# Patient Record
Sex: Female | Born: 1945 | Race: White | Hispanic: No | Smoking: Never smoker
Health system: Southern US, Community
[De-identification: ages and names within clinical notes are randomized; demographics above are authoritative.]

## PROBLEM LIST (undated history)

## (undated) DIAGNOSIS — C50919 Malignant neoplasm of unspecified site of unspecified female breast: Secondary | ICD-10-CM

## (undated) HISTORY — DX: Malignant neoplasm of unspecified site of unspecified female breast: C50.919

---

## 1998-06-15 ENCOUNTER — Other Ambulatory Visit: Admission: RE | Admit: 1998-06-15 | Discharge: 1998-06-15 | Payer: Self-pay | Admitting: Obstetrics and Gynecology

## 1999-09-06 ENCOUNTER — Encounter: Payer: Self-pay | Admitting: Emergency Medicine

## 1999-09-06 ENCOUNTER — Encounter: Admission: RE | Admit: 1999-09-06 | Discharge: 1999-09-06 | Payer: Self-pay | Admitting: Emergency Medicine

## 2000-04-16 ENCOUNTER — Encounter: Payer: Self-pay | Admitting: Obstetrics and Gynecology

## 2000-04-16 ENCOUNTER — Encounter: Admission: RE | Admit: 2000-04-16 | Discharge: 2000-04-16 | Payer: Self-pay | Admitting: Obstetrics and Gynecology

## 2000-05-08 ENCOUNTER — Encounter: Payer: Self-pay | Admitting: Emergency Medicine

## 2000-05-08 ENCOUNTER — Encounter: Admission: RE | Admit: 2000-05-08 | Discharge: 2000-05-08 | Payer: Self-pay | Admitting: Emergency Medicine

## 2001-04-29 ENCOUNTER — Encounter: Payer: Self-pay | Admitting: Emergency Medicine

## 2001-04-29 ENCOUNTER — Encounter: Admission: RE | Admit: 2001-04-29 | Discharge: 2001-04-29 | Payer: Self-pay | Admitting: Emergency Medicine

## 2001-05-01 ENCOUNTER — Encounter: Payer: Self-pay | Admitting: Emergency Medicine

## 2001-05-01 ENCOUNTER — Encounter: Admission: RE | Admit: 2001-05-01 | Discharge: 2001-05-01 | Payer: Self-pay | Admitting: Emergency Medicine

## 2002-04-30 ENCOUNTER — Encounter: Payer: Self-pay | Admitting: Unknown Physician Specialty

## 2002-04-30 ENCOUNTER — Encounter: Admission: RE | Admit: 2002-04-30 | Discharge: 2002-04-30 | Payer: Self-pay | Admitting: Unknown Physician Specialty

## 2003-05-25 ENCOUNTER — Encounter: Payer: Self-pay | Admitting: Unknown Physician Specialty

## 2003-05-25 ENCOUNTER — Encounter: Admission: RE | Admit: 2003-05-25 | Discharge: 2003-05-25 | Payer: Self-pay | Admitting: Unknown Physician Specialty

## 2004-09-03 DIAGNOSIS — C50919 Malignant neoplasm of unspecified site of unspecified female breast: Secondary | ICD-10-CM

## 2004-09-03 HISTORY — DX: Malignant neoplasm of unspecified site of unspecified female breast: C50.919

## 2004-09-03 HISTORY — PX: BREAST LUMPECTOMY: SHX2

## 2004-11-02 ENCOUNTER — Encounter: Admission: RE | Admit: 2004-11-02 | Discharge: 2004-11-02 | Payer: Self-pay | Admitting: Unknown Physician Specialty

## 2004-11-13 ENCOUNTER — Encounter (INDEPENDENT_AMBULATORY_CARE_PROVIDER_SITE_OTHER): Payer: Self-pay | Admitting: Diagnostic Radiology

## 2004-11-13 ENCOUNTER — Encounter: Admission: RE | Admit: 2004-11-13 | Discharge: 2004-11-13 | Payer: Self-pay | Admitting: Unknown Physician Specialty

## 2004-11-13 ENCOUNTER — Encounter (INDEPENDENT_AMBULATORY_CARE_PROVIDER_SITE_OTHER): Payer: Self-pay | Admitting: Specialist

## 2004-11-20 ENCOUNTER — Encounter: Admission: RE | Admit: 2004-11-20 | Discharge: 2004-11-20 | Payer: Self-pay | Admitting: Surgery

## 2004-11-23 ENCOUNTER — Encounter: Admission: RE | Admit: 2004-11-23 | Discharge: 2004-11-23 | Payer: Self-pay | Admitting: Surgery

## 2004-11-24 ENCOUNTER — Encounter: Admission: RE | Admit: 2004-11-24 | Discharge: 2004-11-24 | Payer: Self-pay | Admitting: Surgery

## 2004-11-27 ENCOUNTER — Encounter (INDEPENDENT_AMBULATORY_CARE_PROVIDER_SITE_OTHER): Payer: Self-pay | Admitting: Specialist

## 2004-11-27 ENCOUNTER — Ambulatory Visit (HOSPITAL_BASED_OUTPATIENT_CLINIC_OR_DEPARTMENT_OTHER): Admission: RE | Admit: 2004-11-27 | Discharge: 2004-11-27 | Payer: Self-pay | Admitting: Surgery

## 2004-11-27 ENCOUNTER — Encounter: Admission: RE | Admit: 2004-11-27 | Discharge: 2004-11-27 | Payer: Self-pay | Admitting: Surgery

## 2004-11-27 ENCOUNTER — Ambulatory Visit (HOSPITAL_COMMUNITY): Admission: RE | Admit: 2004-11-27 | Discharge: 2004-11-27 | Payer: Self-pay | Admitting: Surgery

## 2004-11-28 ENCOUNTER — Ambulatory Visit: Admission: RE | Admit: 2004-11-28 | Discharge: 2005-01-26 | Payer: Self-pay | Admitting: Radiation Oncology

## 2004-11-28 ENCOUNTER — Ambulatory Visit: Payer: Self-pay | Admitting: Oncology

## 2004-12-13 ENCOUNTER — Ambulatory Visit (HOSPITAL_COMMUNITY): Admission: RE | Admit: 2004-12-13 | Discharge: 2004-12-13 | Payer: Self-pay | Admitting: Surgery

## 2004-12-27 ENCOUNTER — Ambulatory Visit (HOSPITAL_COMMUNITY): Admission: RE | Admit: 2004-12-27 | Discharge: 2004-12-27 | Payer: Self-pay | Admitting: Oncology

## 2004-12-28 ENCOUNTER — Encounter: Admission: RE | Admit: 2004-12-28 | Discharge: 2004-12-28 | Payer: Self-pay | Admitting: Oncology

## 2005-01-18 ENCOUNTER — Ambulatory Visit: Payer: Self-pay | Admitting: Oncology

## 2005-02-26 ENCOUNTER — Encounter: Admission: RE | Admit: 2005-02-26 | Discharge: 2005-02-26 | Payer: Self-pay | Admitting: Surgery

## 2005-03-13 ENCOUNTER — Ambulatory Visit: Payer: Self-pay | Admitting: Oncology

## 2005-05-08 ENCOUNTER — Ambulatory Visit: Payer: Self-pay | Admitting: Oncology

## 2005-07-06 ENCOUNTER — Encounter: Admission: RE | Admit: 2005-07-06 | Discharge: 2005-07-06 | Payer: Self-pay | Admitting: Radiation Oncology

## 2005-07-24 ENCOUNTER — Encounter: Admission: RE | Admit: 2005-07-24 | Discharge: 2005-07-24 | Payer: Self-pay | Admitting: Surgery

## 2005-08-15 ENCOUNTER — Ambulatory Visit: Payer: Self-pay | Admitting: Oncology

## 2005-11-06 ENCOUNTER — Encounter: Admission: RE | Admit: 2005-11-06 | Discharge: 2005-11-06 | Payer: Self-pay | Admitting: Surgery

## 2005-12-03 ENCOUNTER — Encounter: Admission: RE | Admit: 2005-12-03 | Discharge: 2005-12-03 | Payer: Self-pay | Admitting: Surgery

## 2005-12-13 ENCOUNTER — Ambulatory Visit: Payer: Self-pay | Admitting: Oncology

## 2005-12-14 LAB — COMPREHENSIVE METABOLIC PANEL
ALT: 17 U/L (ref 0–40)
AST: 20 U/L (ref 0–37)
Albumin: 4.6 g/dL (ref 3.5–5.2)
BUN: 14 mg/dL (ref 6–23)
CO2: 28 mEq/L (ref 19–32)
Calcium: 9.1 mg/dL (ref 8.4–10.5)
Chloride: 100 mEq/L (ref 96–112)
Potassium: 4.2 mEq/L (ref 3.5–5.3)

## 2005-12-14 LAB — CBC WITH DIFFERENTIAL/PLATELET
BASO%: 0.5 % (ref 0.0–2.0)
Basophils Absolute: 0 10*3/uL (ref 0.0–0.1)
EOS%: 2.9 % (ref 0.0–7.0)
HCT: 37.7 % (ref 34.8–46.6)
HGB: 12.9 g/dL (ref 11.6–15.9)
MCH: 30.4 pg (ref 26.0–34.0)
MONO#: 0.5 10*3/uL (ref 0.1–0.9)
NEUT#: 2.5 10*3/uL (ref 1.5–6.5)
RDW: 12.9 % (ref 11.3–14.5)
WBC: 4.3 10*3/uL (ref 3.9–10.0)
lymph#: 1.2 10*3/uL (ref 0.9–3.3)

## 2005-12-14 LAB — CANCER ANTIGEN 27.29: CA 27.29: 21 U/mL (ref 0–39)

## 2006-04-12 ENCOUNTER — Ambulatory Visit: Payer: Self-pay | Admitting: Oncology

## 2006-04-12 LAB — CBC WITH DIFFERENTIAL/PLATELET
Basophils Absolute: 0 10*3/uL (ref 0.0–0.1)
EOS%: 1.7 % (ref 0.0–7.0)
Eosinophils Absolute: 0.1 10*3/uL (ref 0.0–0.5)
HGB: 12.9 g/dL (ref 11.6–15.9)
MCH: 30.5 pg (ref 26.0–34.0)
MONO#: 0.5 10*3/uL (ref 0.1–0.9)
NEUT#: 2.9 10*3/uL (ref 1.5–6.5)
RDW: 12.6 % (ref 11.3–14.5)
WBC: 4.8 10*3/uL (ref 3.9–10.0)
lymph#: 1.3 10*3/uL (ref 0.9–3.3)

## 2006-04-12 LAB — COMPREHENSIVE METABOLIC PANEL
ALT: 21 U/L (ref 0–40)
AST: 22 U/L (ref 0–37)
Albumin: 4.6 g/dL (ref 3.5–5.2)
BUN: 12 mg/dL (ref 6–23)
CO2: 25 mEq/L (ref 19–32)
Calcium: 9.4 mg/dL (ref 8.4–10.5)
Chloride: 99 mEq/L (ref 96–112)
Potassium: 4 mEq/L (ref 3.5–5.3)

## 2006-04-12 LAB — LACTATE DEHYDROGENASE: LDH: 145 U/L (ref 94–250)

## 2006-04-12 LAB — CANCER ANTIGEN 27.29: CA 27.29: 20 U/mL (ref 0–39)

## 2006-08-06 ENCOUNTER — Ambulatory Visit: Payer: Self-pay | Admitting: Oncology

## 2006-08-09 LAB — CBC WITH DIFFERENTIAL/PLATELET
BASO%: 0.6 % (ref 0.0–2.0)
Eosinophils Absolute: 0.1 10*3/uL (ref 0.0–0.5)
LYMPH%: 27.3 % (ref 14.0–48.0)
MCHC: 34.3 g/dL (ref 32.0–36.0)
MONO#: 0.4 10*3/uL (ref 0.1–0.9)
NEUT#: 2.7 10*3/uL (ref 1.5–6.5)
Platelets: 228 10*3/uL (ref 145–400)
RBC: 4.32 10*6/uL (ref 3.70–5.32)
RDW: 13 % (ref 11.3–14.5)
WBC: 4.4 10*3/uL (ref 3.9–10.0)

## 2006-08-09 LAB — COMPREHENSIVE METABOLIC PANEL
BUN: 12 mg/dL (ref 6–23)
CO2: 28 mEq/L (ref 19–32)
Calcium: 9.7 mg/dL (ref 8.4–10.5)
Chloride: 101 mEq/L (ref 96–112)
Creatinine, Ser: 0.67 mg/dL (ref 0.40–1.20)
Glucose, Bld: 90 mg/dL (ref 70–99)
Total Bilirubin: 0.3 mg/dL (ref 0.3–1.2)

## 2006-08-09 LAB — LACTATE DEHYDROGENASE: LDH: 164 U/L (ref 94–250)

## 2006-11-08 ENCOUNTER — Encounter: Admission: RE | Admit: 2006-11-08 | Discharge: 2006-11-08 | Payer: Self-pay | Admitting: Oncology

## 2006-12-03 ENCOUNTER — Ambulatory Visit: Payer: Self-pay | Admitting: Oncology

## 2006-12-05 LAB — COMPREHENSIVE METABOLIC PANEL
ALT: 17 U/L (ref 0–35)
AST: 19 U/L (ref 0–37)
Albumin: 4.7 g/dL (ref 3.5–5.2)
Alkaline Phosphatase: 62 U/L (ref 39–117)
Glucose, Bld: 97 mg/dL (ref 70–99)
Potassium: 4.2 mEq/L (ref 3.5–5.3)
Sodium: 138 mEq/L (ref 135–145)
Total Bilirubin: 0.4 mg/dL (ref 0.3–1.2)
Total Protein: 7.1 g/dL (ref 6.0–8.3)

## 2006-12-05 LAB — CBC WITH DIFFERENTIAL/PLATELET
BASO%: 0.4 % (ref 0.0–2.0)
Eosinophils Absolute: 0.1 10*3/uL (ref 0.0–0.5)
LYMPH%: 25.1 % (ref 14.0–48.0)
MCHC: 34.7 g/dL (ref 32.0–36.0)
MCV: 88.3 fL (ref 81.0–101.0)
MONO%: 9.1 % (ref 0.0–13.0)
NEUT#: 3.1 10*3/uL (ref 1.5–6.5)
RBC: 4.31 10*6/uL (ref 3.70–5.32)
RDW: 12.8 % (ref 11.3–14.5)
WBC: 4.9 10*3/uL (ref 3.9–10.0)

## 2006-12-05 LAB — CANCER ANTIGEN 27.29: CA 27.29: 25 U/mL (ref 0–39)

## 2007-06-18 ENCOUNTER — Ambulatory Visit: Payer: Self-pay | Admitting: Oncology

## 2007-06-20 LAB — COMPREHENSIVE METABOLIC PANEL
ALT: 17 U/L (ref 0–35)
AST: 17 U/L (ref 0–37)
BUN: 16 mg/dL (ref 6–23)
Calcium: 9.4 mg/dL (ref 8.4–10.5)
Chloride: 102 mEq/L (ref 96–112)
Creatinine, Ser: 0.75 mg/dL (ref 0.40–1.20)
Total Bilirubin: 0.5 mg/dL (ref 0.3–1.2)

## 2007-06-20 LAB — CBC WITH DIFFERENTIAL/PLATELET
BASO%: 0.4 % (ref 0.0–2.0)
Basophils Absolute: 0 10*3/uL (ref 0.0–0.1)
EOS%: 2.5 % (ref 0.0–7.0)
HCT: 37.7 % (ref 34.8–46.6)
HGB: 13.2 g/dL (ref 11.6–15.9)
LYMPH%: 22.7 % (ref 14.0–48.0)
MCH: 30.7 pg (ref 26.0–34.0)
MCHC: 34.9 g/dL (ref 32.0–36.0)
MCV: 87.9 fL (ref 81.0–101.0)
MONO%: 9 % (ref 0.0–13.0)
NEUT%: 65.4 % (ref 39.6–76.8)
Platelets: 233 10*3/uL (ref 145–400)
lymph#: 1.2 10*3/uL (ref 0.9–3.3)

## 2007-06-20 LAB — CANCER ANTIGEN 27.29: CA 27.29: 20 U/mL (ref 0–39)

## 2007-11-10 ENCOUNTER — Encounter: Admission: RE | Admit: 2007-11-10 | Discharge: 2007-11-10 | Payer: Self-pay | Admitting: Oncology

## 2007-11-18 ENCOUNTER — Encounter: Admission: RE | Admit: 2007-11-18 | Discharge: 2007-11-18 | Payer: Self-pay | Admitting: Oncology

## 2007-12-08 ENCOUNTER — Ambulatory Visit: Payer: Self-pay | Admitting: Oncology

## 2007-12-10 LAB — CBC WITH DIFFERENTIAL/PLATELET
Eosinophils Absolute: 0.1 10*3/uL (ref 0.0–0.5)
HCT: 36.7 % (ref 34.8–46.6)
HGB: 12.9 g/dL (ref 11.6–15.9)
LYMPH%: 24.7 % (ref 14.0–48.0)
MONO#: 0.5 10*3/uL (ref 0.1–0.9)
NEUT#: 3.5 10*3/uL (ref 1.5–6.5)
NEUT%: 63 % (ref 39.6–76.8)
Platelets: 229 10*3/uL (ref 145–400)
WBC: 5.6 10*3/uL (ref 3.9–10.0)

## 2007-12-11 LAB — COMPREHENSIVE METABOLIC PANEL
CO2: 25 mEq/L (ref 19–32)
Calcium: 9.6 mg/dL (ref 8.4–10.5)
Chloride: 100 mEq/L (ref 96–112)
Creatinine, Ser: 0.71 mg/dL (ref 0.40–1.20)
Glucose, Bld: 96 mg/dL (ref 70–99)
Total Bilirubin: 0.5 mg/dL (ref 0.3–1.2)
Total Protein: 7.5 g/dL (ref 6.0–8.3)

## 2007-12-11 LAB — VITAMIN D 25 HYDROXY (VIT D DEFICIENCY, FRACTURES): Vit D, 25-Hydroxy: 27 ng/mL — ABNORMAL LOW (ref 30–89)

## 2007-12-11 LAB — LACTATE DEHYDROGENASE: LDH: 161 U/L (ref 94–250)

## 2007-12-11 LAB — CANCER ANTIGEN 27.29: CA 27.29: 22 U/mL (ref 0–39)

## 2007-12-17 LAB — VITAMIN D 1,25 DIHYDROXY: Vit D, 1,25-Dihydroxy: 68 pg/mL (ref 15–75)

## 2008-06-09 ENCOUNTER — Ambulatory Visit: Payer: Self-pay | Admitting: Hematology & Oncology

## 2008-06-10 LAB — CBC WITH DIFFERENTIAL (CANCER CENTER ONLY)
BASO%: 0.5 % (ref 0.0–2.0)
LYMPH%: 27.1 % (ref 14.0–48.0)
MCH: 29.3 pg (ref 26.0–34.0)
MCV: 86 fL (ref 81–101)
MONO%: 7.9 % (ref 0.0–13.0)
Platelets: 210 10*3/uL (ref 145–400)
RDW: 11.7 % (ref 10.5–14.6)

## 2008-06-10 LAB — COMPREHENSIVE METABOLIC PANEL
ALT: 18 U/L (ref 0–35)
AST: 20 U/L (ref 0–37)
Alkaline Phosphatase: 53 U/L (ref 39–117)
Calcium: 10.2 mg/dL (ref 8.4–10.5)
Chloride: 100 mEq/L (ref 96–112)
Creatinine, Ser: 0.75 mg/dL (ref 0.40–1.20)
Total Bilirubin: 0.5 mg/dL (ref 0.3–1.2)

## 2008-11-16 ENCOUNTER — Encounter: Admission: RE | Admit: 2008-11-16 | Discharge: 2008-11-16 | Payer: Self-pay | Admitting: Unknown Physician Specialty

## 2008-12-15 ENCOUNTER — Ambulatory Visit: Payer: Self-pay | Admitting: Hematology & Oncology

## 2008-12-16 LAB — CBC WITH DIFFERENTIAL (CANCER CENTER ONLY)
BASO#: 0 10*3/uL (ref 0.0–0.2)
Eosinophils Absolute: 0.1 10*3/uL (ref 0.0–0.5)
HGB: 12 g/dL (ref 11.6–15.9)
LYMPH%: 22.3 % (ref 14.0–48.0)
MCH: 29.4 pg (ref 26.0–34.0)
MCV: 89 fL (ref 81–101)
MONO%: 5.3 % (ref 0.0–13.0)
RBC: 4.07 10*6/uL (ref 3.70–5.32)

## 2008-12-16 LAB — COMPREHENSIVE METABOLIC PANEL
Alkaline Phosphatase: 48 U/L (ref 39–117)
Glucose, Bld: 106 mg/dL — ABNORMAL HIGH (ref 70–99)
Sodium: 138 mEq/L (ref 135–145)
Total Bilirubin: 0.4 mg/dL (ref 0.3–1.2)
Total Protein: 6.8 g/dL (ref 6.0–8.3)

## 2009-06-15 ENCOUNTER — Ambulatory Visit: Payer: Self-pay | Admitting: Hematology & Oncology

## 2009-06-16 LAB — CBC WITH DIFFERENTIAL (CANCER CENTER ONLY)
BASO#: 0 10*3/uL (ref 0.0–0.2)
Eosinophils Absolute: 0.1 10*3/uL (ref 0.0–0.5)
HCT: 36.6 % (ref 34.8–46.6)
HGB: 12.4 g/dL (ref 11.6–15.9)
LYMPH#: 1.6 10*3/uL (ref 0.9–3.3)
MCH: 30.4 pg (ref 26.0–34.0)
MONO%: 7.1 % (ref 0.0–13.0)
NEUT#: 3.9 10*3/uL (ref 1.5–6.5)
RBC: 4.09 10*6/uL (ref 3.70–5.32)

## 2009-06-17 LAB — COMPREHENSIVE METABOLIC PANEL
Albumin: 4.4 g/dL (ref 3.5–5.2)
Alkaline Phosphatase: 50 U/L (ref 39–117)
Calcium: 9.5 mg/dL (ref 8.4–10.5)
Total Protein: 6.9 g/dL (ref 6.0–8.3)

## 2009-11-17 ENCOUNTER — Encounter: Admission: RE | Admit: 2009-11-17 | Discharge: 2009-11-17 | Payer: Self-pay | Admitting: Hematology & Oncology

## 2009-12-13 ENCOUNTER — Ambulatory Visit: Payer: Self-pay | Admitting: Hematology & Oncology

## 2009-12-15 LAB — COMPREHENSIVE METABOLIC PANEL
ALT: 19 U/L (ref 0–35)
AST: 24 U/L (ref 0–37)
Alkaline Phosphatase: 56 U/L (ref 39–117)
BUN: 16 mg/dL (ref 6–23)
Chloride: 100 mEq/L (ref 96–112)
Creatinine, Ser: 0.76 mg/dL (ref 0.40–1.20)
Potassium: 3.9 mEq/L (ref 3.5–5.3)
Sodium: 137 mEq/L (ref 135–145)

## 2009-12-15 LAB — CBC WITH DIFFERENTIAL (CANCER CENTER ONLY)
BASO#: 0 10*3/uL (ref 0.0–0.2)
Eosinophils Absolute: 0.1 10*3/uL (ref 0.0–0.5)
HGB: 12.4 g/dL (ref 11.6–15.9)
LYMPH#: 1.5 10*3/uL (ref 0.9–3.3)
MONO#: 0.4 10*3/uL (ref 0.1–0.9)
NEUT#: 3.1 10*3/uL (ref 1.5–6.5)
Platelets: 214 10*3/uL (ref 145–400)
RBC: 4.17 10*6/uL (ref 3.70–5.32)
WBC: 5.2 10*3/uL (ref 3.9–10.0)

## 2009-12-15 LAB — VITAMIN D 25 HYDROXY (VIT D DEFICIENCY, FRACTURES): Vit D, 25-Hydroxy: 52 ng/mL (ref 30–89)

## 2010-10-13 ENCOUNTER — Other Ambulatory Visit: Payer: Self-pay | Admitting: Unknown Physician Specialty

## 2010-10-13 DIAGNOSIS — Z780 Asymptomatic menopausal state: Secondary | ICD-10-CM

## 2010-10-13 DIAGNOSIS — Z9889 Other specified postprocedural states: Secondary | ICD-10-CM

## 2010-11-23 ENCOUNTER — Ambulatory Visit
Admission: RE | Admit: 2010-11-23 | Discharge: 2010-11-23 | Disposition: A | Discharge status: Home / Self Care | Payer: Managed Care, Other (non HMO) | Source: Ambulatory Visit | Attending: *Deleted | Admitting: *Deleted

## 2010-11-23 DIAGNOSIS — Z9889 Other specified postprocedural states: Secondary | ICD-10-CM

## 2010-11-23 DIAGNOSIS — Z780 Asymptomatic menopausal state: Secondary | ICD-10-CM

## 2010-12-13 ENCOUNTER — Encounter (HOSPITAL_BASED_OUTPATIENT_CLINIC_OR_DEPARTMENT_OTHER): Payer: Managed Care, Other (non HMO) | Admitting: Hematology & Oncology

## 2010-12-13 DIAGNOSIS — C50419 Malignant neoplasm of upper-outer quadrant of unspecified female breast: Secondary | ICD-10-CM

## 2010-12-13 DIAGNOSIS — Z170 Estrogen receptor positive status [ER+]: Secondary | ICD-10-CM

## 2010-12-21 ENCOUNTER — Encounter: Payer: Managed Care, Other (non HMO) | Admitting: Hematology & Oncology

## 2010-12-21 ENCOUNTER — Other Ambulatory Visit: Payer: Self-pay | Admitting: Hematology & Oncology

## 2010-12-22 LAB — VITAMIN D 25 HYDROXY (VIT D DEFICIENCY, FRACTURES): Vit D, 25-Hydroxy: 64 ng/mL (ref 30–89)

## 2011-01-19 NOTE — Op Note (Signed)
Katrina Holt, Katrina Holt                  ACCOUNT NO.:  [id number]   MEDICAL RECORD NO.:  [id number]          PATIENT TYPE:  AMB   LOCATION:  DAY                          FACILITY:  [facility]   PHYSICIAN:  Currie Paris, M.D.DATE OF BIRTH:  [birth year]   DATE OF PROCEDURE:  12/13/2004  DATE OF DISCHARGE:                                 OPERATIVE REPORT   PREOPERATIVE DIAGNOSIS:  Carcinoma of the left breast, upper outer quadrant.   POSTOPERATIVE DIAGNOSIS:  Carcinoma of the left breast, upper outer  quadrant.   OPERATION:  Placement of MammoSite.   SURGEON:  Currie Paris, M.D.   ANESTHESIA:  MAC.   CLINICAL HISTORY:  This is a 64 year old lady who has had lumpectomy and was  candidate for MammoSite radiation therapy.   DESCRIPTION OF PROCEDURE:  The patient was seen in the holding area and had  no further questions.  The left breast was marked as the operative site and  confirmed by the patient.   She was taken to the operating room and given some IV sedation.  The breast  was ultrasounded and we could see a good seroma cavity.   The breast was then prepped and draped.  The time-out occurred.   I Infiltrated 1% Xylocaine at the medial aspect of the incision for  placement of the MammoSite with the scar entry technique.  The medial aspect  of the scar was opened with the scalpel and a hemostat used to enter the  cavity.  I had already injected a little local into the cavity as well.  We  got some drainage of the seroma.  We then prepared an oval MammoSite to  place and, unfortunately, this was not symmetric so we checked a second oval  one, which was likewise not symmetric.  I re-assessed the cavity and I  thought that the spherical small one which would be inflated to  approximately 45-60 mL should also work, and this was checked.  It had a  symmetric balloon.   It was placed readily into the seroma cavity.  I put one suture just lateral  to so that the pressure of the  MammoSite would not cause the scar to open  any further.  The balloon was inflated to 45 mL.  Ultrasound showed what appeared to be good conformance of the cavity to the  balloon.  There appeared to be no residual seroma.  The closest skin to  balloon distance was 0.97 cm.   The patient tolerated procedure well.  Sterile dressings were applied.      CJS/MEDQ  D:  12/13/2004  T:  12/13/2004  Job:  [id number]

## 2011-01-19 NOTE — Op Note (Signed)
Katrina Holt, Katrina Holt                  ACCOUNT NO.:  [id number]   MEDICAL RECORD NO.:  [id number]          PATIENT TYPE:  AMB   LOCATION:  DSC                          FACILITY:  [facility]   PHYSICIAN:  Currie Paris, M.D.DATE OF BIRTH:  1946/08/31   DATE OF PROCEDURE:  11/27/2004  DATE OF DISCHARGE:                                 OPERATIVE REPORT   OFFICE MEDICAL RECORD NUMBER:  [id number].   PREOPERATIVE DIAGNOSIS:  Carcinoma left breast upper outer quadrant.   POSTOPERATIVE DIAGNOSIS:  Carcinoma left breast upper outer quadrant.   OPERATION:  Needle-guided excision left breast cancer with blue dye  injection and axillary sentinel lymph node biopsy (two nodes).   SURGEON:  Cyndia Bent, M.D.   ANESTHESIA:  General.   CLINICAL HISTORY:  This is a 65 year old recently found by mammogram to have  a small breast cancer in the upper outer quadrant of the left breast.  After  discussion of alternatives, we elected to proceed to lumpectomy with  sentinel node biopsy.   DESCRIPTION OF PROCEDURE:  The patient was seen in the holding area and she  had no further questions.  She was taken to the operating room and after  satisfactory general anesthesia had been obtained, the breast nipple areolar  area was prepped with alcohol.  The timeout occurred.   I injected about 4 mL of dilute methylene blue subareolarly and massaged  this in.  The breast was then completely prepped with Betadine and draped.  The guide wire entered laterally and tracked medially almost in a direct  horizontal position just in the upper outer quadrant of the breast.  An X  marked the spot on the breast where the tumor was supposedly directly below.  I made a transverse or horizontally oriented incision, staying medial to the  guide wire entry site and divided about 1 cm of breast tissue.  I then was  able to manipulate the guide wire into the wound and then began a wide  excision of the area going down to  fascia and going in all directions.  The  tissue was a little bit soft superficially but I felt that I had a good  margin between the superficial side and the tumor, although the fat had  begun to split a little bit.  By palpation, I could feel the mass once we  had gotten down to it and I felt that I was completely around it.  It was  oriented and sent for specimen mammography and then for touch preps.   I injected some Marcaine to help with postoperative analgesia and placed a  pack once everything was dry.  Using the Neo-Probe, I identified a hot area  in the axilla, made a transverse incision, divided the subcutaneous tissues  and placed a self-retaining retractor.  Blue lymphatic was found and traced  and led to a blue lymph node and there were two adjacent blue lymph nodes  and these were removed.  The first lymph node actually came out of specimen  2 and the second one of specimen 1.  Specimen 2 was much more darkly blue  and had counts up to 900, whereas specimen 1 had counts only of about 90.  There were no other blue lymphatics noticed, no palpable adenopathy, and  counts in the background ranged from 0 to 10 once these two nodes were out.   I again injected some Marcaine here and then checked for hemostasis.  Everything was dry so I closed this incision with 3-0 Vicryl followed by 4-0  Monocryl subcuticular.   Attention was turned back to the breast site and it had remained dry.  I  closed it in layers with some 3-0 Vicryl so that we would have at least 1 cm  of distance between the skin and the lumpectomy site for possible mammosite  placement.  Skin was then closed with 4-0 Monocryl subcuticular.   Pathology reported that the lymph nodes were negative and my margins were  negative.  This concluded the procedure.   All counts were correct.  There were no operative complications.  Estimated  blood loss was less than 20 mL.  The patient tolerated the procedure well.       CJS/MEDQ  D:  11/27/2004  T:  11/27/2004  Job:  [id number]   cc:   Myra Rude, M.D.  [place], [place] [address]

## 2011-06-27 ENCOUNTER — Encounter (HOSPITAL_BASED_OUTPATIENT_CLINIC_OR_DEPARTMENT_OTHER): Payer: Managed Care, Other (non HMO) | Admitting: Hematology & Oncology

## 2011-06-27 DIAGNOSIS — Z853 Personal history of malignant neoplasm of breast: Secondary | ICD-10-CM

## 2011-10-22 ENCOUNTER — Other Ambulatory Visit: Payer: Self-pay | Admitting: Hematology & Oncology

## 2011-10-22 DIAGNOSIS — Z853 Personal history of malignant neoplasm of breast: Secondary | ICD-10-CM

## 2011-10-22 DIAGNOSIS — Z9889 Other specified postprocedural states: Secondary | ICD-10-CM

## 2011-11-26 ENCOUNTER — Ambulatory Visit
Admission: RE | Admit: 2011-11-26 | Discharge: 2011-11-26 | Disposition: A | Discharge status: Home / Self Care | Payer: Commercial Indemnity | Source: Ambulatory Visit | Attending: Hematology & Oncology | Admitting: Hematology & Oncology

## 2011-11-26 DIAGNOSIS — Z853 Personal history of malignant neoplasm of breast: Secondary | ICD-10-CM

## 2011-11-26 DIAGNOSIS — Z9889 Other specified postprocedural states: Secondary | ICD-10-CM

## 2012-06-18 ENCOUNTER — Ambulatory Visit (HOSPITAL_BASED_OUTPATIENT_CLINIC_OR_DEPARTMENT_OTHER): Payer: BC Managed Care – PPO | Admitting: Hematology & Oncology

## 2012-06-18 VITALS — BP 134/65 | HR 65 | Temp 97.8°F | Resp 16 | Ht 64.0 in | Wt 145.0 lb

## 2012-06-18 DIAGNOSIS — Z853 Personal history of malignant neoplasm of breast: Secondary | ICD-10-CM

## 2012-06-18 DIAGNOSIS — M810 Age-related osteoporosis without current pathological fracture: Secondary | ICD-10-CM

## 2012-06-18 DIAGNOSIS — C50919 Malignant neoplasm of unspecified site of unspecified female breast: Secondary | ICD-10-CM

## 2012-06-18 NOTE — Progress Notes (Signed)
This office note has been dictated.

## 2012-06-19 NOTE — Progress Notes (Signed)
CC:   Harl Bowie, MD Currie Paris, M.D.  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  CURRENT THERAPY:  Observation.  INTERIM HISTORY:  Ms Weatherbee comes in for followup.  She is doing well. We see her yearly.  She is still working.  She had no problems last year.  Her last mammogram was done back in March and everything looked okay on the mammogram.  She has had no bony pain.  There has been no change in bowel or bladder habits.  There has been no cough or shortness breath.  She does not take vitamin D.  I recommended to her that she take vitamin D 1000 units a day.  She does take a baby aspirin. She has had no rashes.  She has had no leg swelling.  PHYSICAL EXAMINATION:  This is a well-developed, well-nourished white female in no obvious distress.  Vital signs:  Temperature of 97.8, pulse 65, respiratory rate 16, blood pressure 134/65.  Weight is 145.  Head and Neck Exam:  Shows a normocephalic, atraumatic skull.  There are no ocular or oral lesions.  There are no palpable cervical or supraclavicular lymph nodes.  Lungs:  Clear bilaterally.  Cardiac Exam: Regular rate and rhythm with a normal S1, S2.  There are no murmurs, rubs or bruits.  Breast Exam:  Shows right breast, no masses, edema or erythema.  There is no right axillary adenopathy.  Left breast is well- healed lumpectomy at the 1 o'clock position.  She has a slight contraction of the left breast.  There is some slight firmness at the lumpectomy site.  No distinct masses noted in the left breast.  There is no left axillary adenopathy.  Abdominal Exam:  Soft with good bowel sounds.  There is no fluid wave.  There is no guarding or rebound tenderness.  There is no palpable hepatosplenomegaly.  Back Exam:  No tenderness over the spine, ribs, or hips.  Extremities:  Shows no clubbing, cyanosis or edema.  Skin Exam:  No rashes, ecchymoses or petechia.  Labs were not done this  visit.  IMPRESSION:  Ms. Schwamberger is a very nice 66 year old postmenopausal white female with history of stage I adenocarcinoma of the left breast.  She is doing well.  I see no evidence of recurrent disease.  I do not see that we need to do any special lab work or x-ray tests on her.  I will plan to get her back in 1 more year.   ______________________________ Josph Macho, M.D. PRE/MEDQ  D:  06/18/2012  T:  06/19/2012  Job:  [id number]

## 2012-06-25 ENCOUNTER — Ambulatory Visit: Payer: Managed Care, Other (non HMO) | Admitting: Hematology & Oncology

## 2012-06-25 ENCOUNTER — Ambulatory Visit: Payer: Managed Care, Other (non HMO) | Admitting: Family

## 2012-11-21 ENCOUNTER — Other Ambulatory Visit: Payer: Self-pay | Admitting: Unknown Physician Specialty

## 2012-11-21 DIAGNOSIS — Z9889 Other specified postprocedural states: Secondary | ICD-10-CM

## 2012-11-21 DIAGNOSIS — Z853 Personal history of malignant neoplasm of breast: Secondary | ICD-10-CM

## 2012-11-21 DIAGNOSIS — M8580 Other specified disorders of bone density and structure, unspecified site: Secondary | ICD-10-CM

## 2012-12-11 ENCOUNTER — Ambulatory Visit
Admission: RE | Admit: 2012-12-11 | Discharge: 2012-12-11 | Disposition: A | Discharge status: Home / Self Care | Payer: BC Managed Care – PPO | Source: Ambulatory Visit | Attending: Unknown Physician Specialty | Admitting: Unknown Physician Specialty

## 2012-12-11 DIAGNOSIS — Z9889 Other specified postprocedural states: Secondary | ICD-10-CM

## 2012-12-11 DIAGNOSIS — Z853 Personal history of malignant neoplasm of breast: Secondary | ICD-10-CM

## 2012-12-11 DIAGNOSIS — M8580 Other specified disorders of bone density and structure, unspecified site: Secondary | ICD-10-CM

## 2013-02-10 DIAGNOSIS — Q211 Atrial septal defect: Secondary | ICD-10-CM | POA: Diagnosis not present

## 2013-02-10 DIAGNOSIS — I059 Rheumatic mitral valve disease, unspecified: Secondary | ICD-10-CM | POA: Diagnosis not present

## 2013-03-02 DIAGNOSIS — H43819 Vitreous degeneration, unspecified eye: Secondary | ICD-10-CM | POA: Diagnosis not present

## 2013-03-02 DIAGNOSIS — H04129 Dry eye syndrome of unspecified lacrimal gland: Secondary | ICD-10-CM | POA: Diagnosis not present

## 2013-03-02 DIAGNOSIS — H40019 Open angle with borderline findings, low risk, unspecified eye: Secondary | ICD-10-CM | POA: Diagnosis not present

## 2013-03-02 DIAGNOSIS — Z961 Presence of intraocular lens: Secondary | ICD-10-CM | POA: Diagnosis not present

## 2013-04-01 DIAGNOSIS — M25819 Other specified joint disorders, unspecified shoulder: Secondary | ICD-10-CM | POA: Diagnosis not present

## 2013-04-01 DIAGNOSIS — M19019 Primary osteoarthritis, unspecified shoulder: Secondary | ICD-10-CM | POA: Diagnosis not present

## 2013-04-01 DIAGNOSIS — Z809 Family history of malignant neoplasm, unspecified: Secondary | ICD-10-CM | POA: Diagnosis not present

## 2013-04-01 DIAGNOSIS — M249 Joint derangement, unspecified: Secondary | ICD-10-CM | POA: Diagnosis not present

## 2013-04-01 DIAGNOSIS — I10 Essential (primary) hypertension: Secondary | ICD-10-CM | POA: Diagnosis not present

## 2013-04-01 DIAGNOSIS — Z853 Personal history of malignant neoplasm of breast: Secondary | ICD-10-CM | POA: Diagnosis not present

## 2013-04-01 DIAGNOSIS — M24019 Loose body in unspecified shoulder: Secondary | ICD-10-CM | POA: Diagnosis not present

## 2013-04-01 DIAGNOSIS — I739 Peripheral vascular disease, unspecified: Secondary | ICD-10-CM | POA: Diagnosis not present

## 2013-04-01 DIAGNOSIS — M75110 Incomplete rotator cuff tear or rupture of unspecified shoulder, not specified as traumatic: Secondary | ICD-10-CM | POA: Diagnosis not present

## 2013-04-01 DIAGNOSIS — M25519 Pain in unspecified shoulder: Secondary | ICD-10-CM | POA: Diagnosis not present

## 2013-04-01 DIAGNOSIS — S46819A Strain of other muscles, fascia and tendons at shoulder and upper arm level, unspecified arm, initial encounter: Secondary | ICD-10-CM | POA: Diagnosis not present

## 2013-04-09 DIAGNOSIS — M25519 Pain in unspecified shoulder: Secondary | ICD-10-CM | POA: Diagnosis not present

## 2013-04-20 DIAGNOSIS — M25519 Pain in unspecified shoulder: Secondary | ICD-10-CM | POA: Diagnosis not present

## 2013-04-28 DIAGNOSIS — M25519 Pain in unspecified shoulder: Secondary | ICD-10-CM | POA: Diagnosis not present

## 2013-05-07 DIAGNOSIS — M25519 Pain in unspecified shoulder: Secondary | ICD-10-CM | POA: Diagnosis not present

## 2013-05-14 DIAGNOSIS — M25519 Pain in unspecified shoulder: Secondary | ICD-10-CM | POA: Diagnosis not present

## 2013-05-27 DIAGNOSIS — M25519 Pain in unspecified shoulder: Secondary | ICD-10-CM | POA: Diagnosis not present

## 2013-06-03 DIAGNOSIS — M25519 Pain in unspecified shoulder: Secondary | ICD-10-CM | POA: Diagnosis not present

## 2013-06-16 DIAGNOSIS — M25519 Pain in unspecified shoulder: Secondary | ICD-10-CM | POA: Diagnosis not present

## 2013-06-18 ENCOUNTER — Ambulatory Visit (HOSPITAL_BASED_OUTPATIENT_CLINIC_OR_DEPARTMENT_OTHER): Payer: Medicare Other | Admitting: Hematology & Oncology

## 2013-06-18 ENCOUNTER — Other Ambulatory Visit (HOSPITAL_BASED_OUTPATIENT_CLINIC_OR_DEPARTMENT_OTHER): Payer: Medicare Other | Admitting: Lab

## 2013-06-18 VITALS — BP 153/80 | HR 62 | Temp 97.7°F | Resp 14 | Ht 64.0 in | Wt 151.0 lb

## 2013-06-18 DIAGNOSIS — D0590 Unspecified type of carcinoma in situ of unspecified breast: Secondary | ICD-10-CM

## 2013-06-18 DIAGNOSIS — C50912 Malignant neoplasm of unspecified site of left female breast: Secondary | ICD-10-CM

## 2013-06-18 DIAGNOSIS — C50919 Malignant neoplasm of unspecified site of unspecified female breast: Secondary | ICD-10-CM

## 2013-06-18 DIAGNOSIS — M810 Age-related osteoporosis without current pathological fracture: Secondary | ICD-10-CM

## 2013-06-18 LAB — CBC WITH DIFFERENTIAL (CANCER CENTER ONLY)
EOS%: 1.8 % (ref 0.0–7.0)
Eosinophils Absolute: 0.1 10*3/uL (ref 0.0–0.5)
LYMPH%: 24.6 % (ref 14.0–48.0)
MCH: 30.4 pg (ref 26.0–34.0)
MCHC: 33.4 g/dL (ref 32.0–36.0)
MCV: 91 fL (ref 81–101)
MONO%: 10.4 % (ref 0.0–13.0)
NEUT#: 3.9 10*3/uL (ref 1.5–6.5)
Platelets: 211 10*3/uL (ref 145–400)
RBC: 4.04 10*6/uL (ref 3.70–5.32)
RDW: 12.7 % (ref 11.1–15.7)

## 2013-06-18 NOTE — Progress Notes (Signed)
This office note has been dictated.

## 2013-06-19 DIAGNOSIS — Z23 Encounter for immunization: Secondary | ICD-10-CM | POA: Diagnosis not present

## 2013-06-19 LAB — COMPREHENSIVE METABOLIC PANEL
AST: 23 U/L (ref 0–37)
Alkaline Phosphatase: 55 U/L (ref 39–117)
Glucose, Bld: 91 mg/dL (ref 70–99)
Sodium: 135 mEq/L (ref 135–145)
Total Bilirubin: 0.4 mg/dL (ref 0.3–1.2)
Total Protein: 6.8 g/dL (ref 6.0–8.3)

## 2013-06-19 NOTE — Progress Notes (Signed)
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  CURRENT THERAPY:  Observation.  INTERIM HISTORY:  Ms. Timko comes in for followup.  She is doing pretty well.  She has noted that she has had some hair loss.  Her hair was thick to begin with.  However, she has noticed this over the past several months.  She had her thyroid checked earlier this year.  This was normal.  There has been no change in her medications.  She has had no other issues.  She had shoulder surgery for the right shoulder in July.  This is doing better.  She is recovering from this.  She has had no problems with bowels or bladder.  She has had no leg swelling.  There have been no rashes.  Her last mammogram was back in April and .everything looked fine.  PHYSICAL EXAMINATION:  General:  This is a well-developed, well- nourished white female in no obvious distress.  Vital Signs:  Showed temperature of 97.7, pulse 62, respiratory rate 14, blood pressure 153/80.  Weight is 151 pounds.  Head and Neck:  Shows a normocephalic, atraumatic skull.  There are no ocular or oral lesions.  There are no palpable cervical or supraclavicular lymph nodes.  Lungs:  Clear bilaterally.  Cardiac:  Regular rate and rhythm with a normal S1, S2. There are no murmurs, rubs, or bruits.  Abdomen:  Soft.  She has good bowel sounds.  There is no fluid wave.  There is no palpable abdominal mass.  There is no palpable hepatosplenomegaly.  Extremities:  Show no clubbing, cyanosis, or edema.  She has good strength in her arms and legs.  She does have decreased range of motion of the right shoulder from her surgery.  Breasts:  Shows right breast with no masses, edema, or erythema.  There is no right axillary adenopathy.  Left breast shows some slight contraction at the lumpectomy site.  Her lumpectomy is at the 1 o'clock position.  No distinct masses noted in the left breast. There is no left axillary  adenopathy.  LABORATORY STUDIES:  White cell count is 6.3, hemoglobin 12.3, hematocrit 36.8, platelet count 211.  IMPRESSION:  Ms. Livingston is very charming 67 year old postmenopausal female with stage I ductal carcinoma of the left breast.  She underwent lumpectomy.  She was taking Arimidex.  She had this 8 years ago.  She has been on Arimidex now for 3 years.  I do not see any evidence of recurrence of her malignancy.  We will go ahead and plan to get her back in another year.    ______________________________ Josph Macho, M.D. PRE/MEDQ  D:  06/18/2013  T:  06/19/2013  Job:  [id number]

## 2013-07-01 DIAGNOSIS — M25579 Pain in unspecified ankle and joints of unspecified foot: Secondary | ICD-10-CM | POA: Diagnosis not present

## 2013-07-13 DIAGNOSIS — S0180XA Unspecified open wound of other part of head, initial encounter: Secondary | ICD-10-CM | POA: Diagnosis not present

## 2013-07-13 DIAGNOSIS — Y92009 Unspecified place in unspecified non-institutional (private) residence as the place of occurrence of the external cause: Secondary | ICD-10-CM | POA: Diagnosis not present

## 2013-07-20 DIAGNOSIS — T148XXA Other injury of unspecified body region, initial encounter: Secondary | ICD-10-CM | POA: Diagnosis not present

## 2013-07-20 DIAGNOSIS — Z4802 Encounter for removal of sutures: Secondary | ICD-10-CM | POA: Diagnosis not present

## 2013-08-20 DIAGNOSIS — I472 Ventricular tachycardia: Secondary | ICD-10-CM | POA: Diagnosis not present

## 2013-08-20 DIAGNOSIS — I059 Rheumatic mitral valve disease, unspecified: Secondary | ICD-10-CM | POA: Diagnosis not present

## 2013-08-20 DIAGNOSIS — Q211 Atrial septal defect: Secondary | ICD-10-CM | POA: Diagnosis not present

## 2013-08-20 DIAGNOSIS — R002 Palpitations: Secondary | ICD-10-CM | POA: Diagnosis not present

## 2013-08-31 DIAGNOSIS — I079 Rheumatic tricuspid valve disease, unspecified: Secondary | ICD-10-CM | POA: Diagnosis not present

## 2013-08-31 DIAGNOSIS — I517 Cardiomegaly: Secondary | ICD-10-CM | POA: Diagnosis not present

## 2013-08-31 DIAGNOSIS — I059 Rheumatic mitral valve disease, unspecified: Secondary | ICD-10-CM | POA: Diagnosis not present

## 2013-08-31 DIAGNOSIS — Z7982 Long term (current) use of aspirin: Secondary | ICD-10-CM | POA: Diagnosis not present

## 2013-08-31 DIAGNOSIS — Q211 Atrial septal defect: Secondary | ICD-10-CM | POA: Diagnosis not present

## 2013-09-01 DIAGNOSIS — I059 Rheumatic mitral valve disease, unspecified: Secondary | ICD-10-CM | POA: Diagnosis not present

## 2013-09-01 DIAGNOSIS — Q2111 Secundum atrial septal defect: Secondary | ICD-10-CM | POA: Diagnosis not present

## 2013-09-01 DIAGNOSIS — I517 Cardiomegaly: Secondary | ICD-10-CM | POA: Diagnosis not present

## 2013-09-01 DIAGNOSIS — I259 Chronic ischemic heart disease, unspecified: Secondary | ICD-10-CM | POA: Diagnosis not present

## 2013-09-01 DIAGNOSIS — Z7982 Long term (current) use of aspirin: Secondary | ICD-10-CM | POA: Diagnosis not present

## 2013-09-01 DIAGNOSIS — Q211 Atrial septal defect: Secondary | ICD-10-CM | POA: Diagnosis not present

## 2013-09-01 DIAGNOSIS — I079 Rheumatic tricuspid valve disease, unspecified: Secondary | ICD-10-CM | POA: Diagnosis not present

## 2013-09-18 DIAGNOSIS — M940 Chondrocostal junction syndrome [Tietze]: Secondary | ICD-10-CM | POA: Diagnosis not present

## 2013-09-22 DIAGNOSIS — Q211 Atrial septal defect: Secondary | ICD-10-CM | POA: Diagnosis not present

## 2013-09-22 DIAGNOSIS — I059 Rheumatic mitral valve disease, unspecified: Secondary | ICD-10-CM | POA: Diagnosis not present

## 2013-09-22 DIAGNOSIS — Q2111 Secundum atrial septal defect: Secondary | ICD-10-CM | POA: Diagnosis not present

## 2013-10-26 DIAGNOSIS — J309 Allergic rhinitis, unspecified: Secondary | ICD-10-CM | POA: Diagnosis not present

## 2013-10-26 DIAGNOSIS — D510 Vitamin B12 deficiency anemia due to intrinsic factor deficiency: Secondary | ICD-10-CM | POA: Diagnosis not present

## 2013-10-26 DIAGNOSIS — Z124 Encounter for screening for malignant neoplasm of cervix: Secondary | ICD-10-CM | POA: Diagnosis not present

## 2013-10-26 DIAGNOSIS — R5383 Other fatigue: Secondary | ICD-10-CM | POA: Diagnosis not present

## 2013-10-26 DIAGNOSIS — E7800 Pure hypercholesterolemia, unspecified: Secondary | ICD-10-CM | POA: Diagnosis not present

## 2013-10-26 DIAGNOSIS — Z85828 Personal history of other malignant neoplasm of skin: Secondary | ICD-10-CM | POA: Diagnosis not present

## 2013-10-26 DIAGNOSIS — Z23 Encounter for immunization: Secondary | ICD-10-CM | POA: Diagnosis not present

## 2013-10-26 DIAGNOSIS — I059 Rheumatic mitral valve disease, unspecified: Secondary | ICD-10-CM | POA: Diagnosis not present

## 2013-10-26 DIAGNOSIS — M949 Disorder of cartilage, unspecified: Secondary | ICD-10-CM | POA: Diagnosis not present

## 2013-10-26 DIAGNOSIS — Z9849 Cataract extraction status, unspecified eye: Secondary | ICD-10-CM | POA: Diagnosis not present

## 2013-10-26 DIAGNOSIS — I519 Heart disease, unspecified: Secondary | ICD-10-CM | POA: Diagnosis not present

## 2013-10-26 DIAGNOSIS — M899 Disorder of bone, unspecified: Secondary | ICD-10-CM | POA: Diagnosis not present

## 2013-10-26 DIAGNOSIS — R5381 Other malaise: Secondary | ICD-10-CM | POA: Diagnosis not present

## 2013-10-26 DIAGNOSIS — D649 Anemia, unspecified: Secondary | ICD-10-CM | POA: Diagnosis not present

## 2013-10-27 DIAGNOSIS — Q211 Atrial septal defect: Secondary | ICD-10-CM | POA: Diagnosis not present

## 2013-10-27 DIAGNOSIS — I4729 Other ventricular tachycardia: Secondary | ICD-10-CM | POA: Diagnosis present

## 2013-10-27 DIAGNOSIS — J90 Pleural effusion, not elsewhere classified: Secondary | ICD-10-CM | POA: Diagnosis not present

## 2013-10-27 DIAGNOSIS — I369 Nonrheumatic tricuspid valve disorder, unspecified: Secondary | ICD-10-CM | POA: Diagnosis not present

## 2013-10-27 DIAGNOSIS — R918 Other nonspecific abnormal finding of lung field: Secondary | ICD-10-CM | POA: Diagnosis not present

## 2013-10-27 DIAGNOSIS — Z7982 Long term (current) use of aspirin: Secondary | ICD-10-CM | POA: Diagnosis not present

## 2013-10-27 DIAGNOSIS — J9383 Other pneumothorax: Secondary | ICD-10-CM | POA: Diagnosis not present

## 2013-10-27 DIAGNOSIS — Z01818 Encounter for other preprocedural examination: Secondary | ICD-10-CM | POA: Diagnosis not present

## 2013-10-27 DIAGNOSIS — I079 Rheumatic tricuspid valve disease, unspecified: Secondary | ICD-10-CM | POA: Diagnosis not present

## 2013-10-27 DIAGNOSIS — I059 Rheumatic mitral valve disease, unspecified: Secondary | ICD-10-CM | POA: Diagnosis not present

## 2013-10-27 DIAGNOSIS — Q348 Other specified congenital malformations of respiratory system: Secondary | ICD-10-CM | POA: Diagnosis not present

## 2013-10-27 DIAGNOSIS — E328 Other diseases of thymus: Secondary | ICD-10-CM | POA: Diagnosis not present

## 2013-10-27 DIAGNOSIS — Z853 Personal history of malignant neoplasm of breast: Secondary | ICD-10-CM | POA: Diagnosis not present

## 2013-10-27 DIAGNOSIS — Z4682 Encounter for fitting and adjustment of non-vascular catheter: Secondary | ICD-10-CM | POA: Diagnosis not present

## 2013-10-27 DIAGNOSIS — Q2111 Secundum atrial septal defect: Secondary | ICD-10-CM | POA: Diagnosis not present

## 2013-10-27 DIAGNOSIS — I472 Ventricular tachycardia: Secondary | ICD-10-CM | POA: Diagnosis present

## 2013-10-27 DIAGNOSIS — Z9849 Cataract extraction status, unspecified eye: Secondary | ICD-10-CM | POA: Diagnosis not present

## 2013-10-27 DIAGNOSIS — I2789 Other specified pulmonary heart diseases: Secondary | ICD-10-CM | POA: Diagnosis not present

## 2013-10-27 DIAGNOSIS — I4891 Unspecified atrial fibrillation: Secondary | ICD-10-CM | POA: Diagnosis not present

## 2013-11-12 DIAGNOSIS — I517 Cardiomegaly: Secondary | ICD-10-CM | POA: Diagnosis not present

## 2013-11-12 DIAGNOSIS — E785 Hyperlipidemia, unspecified: Secondary | ICD-10-CM | POA: Diagnosis not present

## 2013-11-12 DIAGNOSIS — M949 Disorder of cartilage, unspecified: Secondary | ICD-10-CM | POA: Diagnosis not present

## 2013-11-12 DIAGNOSIS — M5030 Other cervical disc degeneration, unspecified cervical region: Secondary | ICD-10-CM | POA: Diagnosis not present

## 2013-11-12 DIAGNOSIS — M899 Disorder of bone, unspecified: Secondary | ICD-10-CM | POA: Diagnosis not present

## 2013-11-12 DIAGNOSIS — R918 Other nonspecific abnormal finding of lung field: Secondary | ICD-10-CM | POA: Diagnosis not present

## 2013-11-12 DIAGNOSIS — R9431 Abnormal electrocardiogram [ECG] [EKG]: Secondary | ICD-10-CM | POA: Diagnosis not present

## 2013-11-12 DIAGNOSIS — J90 Pleural effusion, not elsewhere classified: Secondary | ICD-10-CM | POA: Diagnosis not present

## 2013-11-12 DIAGNOSIS — Z9889 Other specified postprocedural states: Secondary | ICD-10-CM | POA: Diagnosis not present

## 2013-11-12 DIAGNOSIS — Z954 Presence of other heart-valve replacement: Secondary | ICD-10-CM | POA: Diagnosis not present

## 2013-11-12 DIAGNOSIS — Q7649 Other congenital malformations of spine, not associated with scoliosis: Secondary | ICD-10-CM | POA: Diagnosis not present

## 2013-11-12 DIAGNOSIS — J9819 Other pulmonary collapse: Secondary | ICD-10-CM | POA: Diagnosis not present

## 2013-11-23 DIAGNOSIS — R05 Cough: Secondary | ICD-10-CM | POA: Diagnosis not present

## 2013-11-23 DIAGNOSIS — R52 Pain, unspecified: Secondary | ICD-10-CM | POA: Diagnosis not present

## 2013-11-23 DIAGNOSIS — J111 Influenza due to unidentified influenza virus with other respiratory manifestations: Secondary | ICD-10-CM | POA: Diagnosis not present

## 2013-11-23 DIAGNOSIS — R059 Cough, unspecified: Secondary | ICD-10-CM | POA: Diagnosis not present

## 2013-11-23 DIAGNOSIS — J9801 Acute bronchospasm: Secondary | ICD-10-CM | POA: Diagnosis not present

## 2013-11-23 DIAGNOSIS — R509 Fever, unspecified: Secondary | ICD-10-CM | POA: Diagnosis not present

## 2013-11-27 DIAGNOSIS — Z9889 Other specified postprocedural states: Secondary | ICD-10-CM | POA: Diagnosis not present

## 2013-11-27 DIAGNOSIS — J90 Pleural effusion, not elsewhere classified: Secondary | ICD-10-CM | POA: Diagnosis not present

## 2013-12-02 ENCOUNTER — Other Ambulatory Visit: Payer: Self-pay

## 2013-12-02 DIAGNOSIS — Z1231 Encounter for screening mammogram for malignant neoplasm of breast: Secondary | ICD-10-CM

## 2013-12-02 DIAGNOSIS — Z853 Personal history of malignant neoplasm of breast: Secondary | ICD-10-CM

## 2013-12-03 DIAGNOSIS — R911 Solitary pulmonary nodule: Secondary | ICD-10-CM | POA: Diagnosis not present

## 2013-12-03 DIAGNOSIS — R05 Cough: Secondary | ICD-10-CM | POA: Diagnosis not present

## 2013-12-03 DIAGNOSIS — R9389 Abnormal findings on diagnostic imaging of other specified body structures: Secondary | ICD-10-CM | POA: Diagnosis not present

## 2013-12-03 DIAGNOSIS — J90 Pleural effusion, not elsewhere classified: Secondary | ICD-10-CM | POA: Diagnosis not present

## 2013-12-03 DIAGNOSIS — R059 Cough, unspecified: Secondary | ICD-10-CM | POA: Diagnosis not present

## 2013-12-03 DIAGNOSIS — R918 Other nonspecific abnormal finding of lung field: Secondary | ICD-10-CM | POA: Diagnosis not present

## 2013-12-14 ENCOUNTER — Ambulatory Visit
Admission: RE | Admit: 2013-12-14 | Discharge: 2013-12-14 | Disposition: A | Discharge status: Home / Self Care | Payer: Medicare Other | Source: Ambulatory Visit

## 2013-12-14 DIAGNOSIS — Z1231 Encounter for screening mammogram for malignant neoplasm of breast: Secondary | ICD-10-CM | POA: Diagnosis not present

## 2013-12-14 DIAGNOSIS — Z853 Personal history of malignant neoplasm of breast: Secondary | ICD-10-CM

## 2013-12-17 DIAGNOSIS — I059 Rheumatic mitral valve disease, unspecified: Secondary | ICD-10-CM | POA: Diagnosis not present

## 2013-12-17 DIAGNOSIS — Z9889 Other specified postprocedural states: Secondary | ICD-10-CM | POA: Diagnosis not present

## 2013-12-28 DIAGNOSIS — R05 Cough: Secondary | ICD-10-CM | POA: Diagnosis not present

## 2013-12-28 DIAGNOSIS — R059 Cough, unspecified: Secondary | ICD-10-CM | POA: Diagnosis not present

## 2013-12-28 DIAGNOSIS — H40019 Open angle with borderline findings, low risk, unspecified eye: Secondary | ICD-10-CM | POA: Diagnosis not present

## 2014-01-05 DIAGNOSIS — I4729 Other ventricular tachycardia: Secondary | ICD-10-CM | POA: Diagnosis not present

## 2014-01-05 DIAGNOSIS — Z9889 Other specified postprocedural states: Secondary | ICD-10-CM | POA: Diagnosis not present

## 2014-01-05 DIAGNOSIS — R002 Palpitations: Secondary | ICD-10-CM | POA: Diagnosis not present

## 2014-01-05 DIAGNOSIS — I059 Rheumatic mitral valve disease, unspecified: Secondary | ICD-10-CM | POA: Diagnosis not present

## 2014-01-05 DIAGNOSIS — Q2111 Secundum atrial septal defect: Secondary | ICD-10-CM | POA: Diagnosis not present

## 2014-01-05 DIAGNOSIS — Q211 Atrial septal defect: Secondary | ICD-10-CM | POA: Diagnosis not present

## 2014-01-05 DIAGNOSIS — I472 Ventricular tachycardia: Secondary | ICD-10-CM | POA: Diagnosis not present

## 2014-01-05 DIAGNOSIS — Z5189 Encounter for other specified aftercare: Secondary | ICD-10-CM | POA: Diagnosis not present

## 2014-01-06 DIAGNOSIS — I4729 Other ventricular tachycardia: Secondary | ICD-10-CM | POA: Diagnosis not present

## 2014-01-06 DIAGNOSIS — Z9889 Other specified postprocedural states: Secondary | ICD-10-CM | POA: Diagnosis not present

## 2014-01-06 DIAGNOSIS — Q2111 Secundum atrial septal defect: Secondary | ICD-10-CM | POA: Diagnosis not present

## 2014-01-06 DIAGNOSIS — I059 Rheumatic mitral valve disease, unspecified: Secondary | ICD-10-CM | POA: Diagnosis not present

## 2014-01-06 DIAGNOSIS — Z5189 Encounter for other specified aftercare: Secondary | ICD-10-CM | POA: Diagnosis not present

## 2014-01-06 DIAGNOSIS — I472 Ventricular tachycardia: Secondary | ICD-10-CM | POA: Diagnosis not present

## 2014-01-06 DIAGNOSIS — Q211 Atrial septal defect: Secondary | ICD-10-CM | POA: Diagnosis not present

## 2014-01-06 DIAGNOSIS — R002 Palpitations: Secondary | ICD-10-CM | POA: Diagnosis not present

## 2014-01-08 DIAGNOSIS — I059 Rheumatic mitral valve disease, unspecified: Secondary | ICD-10-CM | POA: Diagnosis not present

## 2014-01-08 DIAGNOSIS — Q2111 Secundum atrial septal defect: Secondary | ICD-10-CM | POA: Diagnosis not present

## 2014-01-08 DIAGNOSIS — Q211 Atrial septal defect: Secondary | ICD-10-CM | POA: Diagnosis not present

## 2014-01-08 DIAGNOSIS — Z9889 Other specified postprocedural states: Secondary | ICD-10-CM | POA: Diagnosis not present

## 2014-01-08 DIAGNOSIS — R002 Palpitations: Secondary | ICD-10-CM | POA: Diagnosis not present

## 2014-01-08 DIAGNOSIS — I4729 Other ventricular tachycardia: Secondary | ICD-10-CM | POA: Diagnosis not present

## 2014-01-08 DIAGNOSIS — Z5189 Encounter for other specified aftercare: Secondary | ICD-10-CM | POA: Diagnosis not present

## 2014-01-08 DIAGNOSIS — I472 Ventricular tachycardia: Secondary | ICD-10-CM | POA: Diagnosis not present

## 2014-01-12 DIAGNOSIS — I4729 Other ventricular tachycardia: Secondary | ICD-10-CM | POA: Diagnosis not present

## 2014-01-12 DIAGNOSIS — Q2111 Secundum atrial septal defect: Secondary | ICD-10-CM | POA: Diagnosis not present

## 2014-01-12 DIAGNOSIS — I472 Ventricular tachycardia: Secondary | ICD-10-CM | POA: Diagnosis not present

## 2014-01-12 DIAGNOSIS — R002 Palpitations: Secondary | ICD-10-CM | POA: Diagnosis not present

## 2014-01-12 DIAGNOSIS — Z5189 Encounter for other specified aftercare: Secondary | ICD-10-CM | POA: Diagnosis not present

## 2014-01-12 DIAGNOSIS — I059 Rheumatic mitral valve disease, unspecified: Secondary | ICD-10-CM | POA: Diagnosis not present

## 2014-01-12 DIAGNOSIS — Q211 Atrial septal defect: Secondary | ICD-10-CM | POA: Diagnosis not present

## 2014-01-12 DIAGNOSIS — Z9889 Other specified postprocedural states: Secondary | ICD-10-CM | POA: Diagnosis not present

## 2014-01-13 DIAGNOSIS — I4729 Other ventricular tachycardia: Secondary | ICD-10-CM | POA: Diagnosis not present

## 2014-01-13 DIAGNOSIS — Q211 Atrial septal defect: Secondary | ICD-10-CM | POA: Diagnosis not present

## 2014-01-13 DIAGNOSIS — I472 Ventricular tachycardia: Secondary | ICD-10-CM | POA: Diagnosis not present

## 2014-01-13 DIAGNOSIS — Z9889 Other specified postprocedural states: Secondary | ICD-10-CM | POA: Diagnosis not present

## 2014-01-13 DIAGNOSIS — I059 Rheumatic mitral valve disease, unspecified: Secondary | ICD-10-CM | POA: Diagnosis not present

## 2014-01-13 DIAGNOSIS — R002 Palpitations: Secondary | ICD-10-CM | POA: Diagnosis not present

## 2014-01-13 DIAGNOSIS — Q2111 Secundum atrial septal defect: Secondary | ICD-10-CM | POA: Diagnosis not present

## 2014-01-13 DIAGNOSIS — Z5189 Encounter for other specified aftercare: Secondary | ICD-10-CM | POA: Diagnosis not present

## 2014-01-15 DIAGNOSIS — Z5189 Encounter for other specified aftercare: Secondary | ICD-10-CM | POA: Diagnosis not present

## 2014-01-15 DIAGNOSIS — I472 Ventricular tachycardia: Secondary | ICD-10-CM | POA: Diagnosis not present

## 2014-01-15 DIAGNOSIS — Q211 Atrial septal defect: Secondary | ICD-10-CM | POA: Diagnosis not present

## 2014-01-15 DIAGNOSIS — Z9889 Other specified postprocedural states: Secondary | ICD-10-CM | POA: Diagnosis not present

## 2014-01-15 DIAGNOSIS — R002 Palpitations: Secondary | ICD-10-CM | POA: Diagnosis not present

## 2014-01-15 DIAGNOSIS — I059 Rheumatic mitral valve disease, unspecified: Secondary | ICD-10-CM | POA: Diagnosis not present

## 2014-01-15 DIAGNOSIS — Q2111 Secundum atrial septal defect: Secondary | ICD-10-CM | POA: Diagnosis not present

## 2014-01-15 DIAGNOSIS — I4729 Other ventricular tachycardia: Secondary | ICD-10-CM | POA: Diagnosis not present

## 2014-01-19 DIAGNOSIS — I4729 Other ventricular tachycardia: Secondary | ICD-10-CM | POA: Diagnosis not present

## 2014-01-19 DIAGNOSIS — Q211 Atrial septal defect: Secondary | ICD-10-CM | POA: Diagnosis not present

## 2014-01-19 DIAGNOSIS — R002 Palpitations: Secondary | ICD-10-CM | POA: Diagnosis not present

## 2014-01-19 DIAGNOSIS — Z5189 Encounter for other specified aftercare: Secondary | ICD-10-CM | POA: Diagnosis not present

## 2014-01-19 DIAGNOSIS — Z9889 Other specified postprocedural states: Secondary | ICD-10-CM | POA: Diagnosis not present

## 2014-01-19 DIAGNOSIS — I472 Ventricular tachycardia: Secondary | ICD-10-CM | POA: Diagnosis not present

## 2014-01-19 DIAGNOSIS — Q2111 Secundum atrial septal defect: Secondary | ICD-10-CM | POA: Diagnosis not present

## 2014-01-19 DIAGNOSIS — I059 Rheumatic mitral valve disease, unspecified: Secondary | ICD-10-CM | POA: Diagnosis not present

## 2014-01-22 DIAGNOSIS — I4729 Other ventricular tachycardia: Secondary | ICD-10-CM | POA: Diagnosis not present

## 2014-01-22 DIAGNOSIS — Z9889 Other specified postprocedural states: Secondary | ICD-10-CM | POA: Diagnosis not present

## 2014-01-22 DIAGNOSIS — Z5189 Encounter for other specified aftercare: Secondary | ICD-10-CM | POA: Diagnosis not present

## 2014-01-22 DIAGNOSIS — Q211 Atrial septal defect: Secondary | ICD-10-CM | POA: Diagnosis not present

## 2014-01-22 DIAGNOSIS — I059 Rheumatic mitral valve disease, unspecified: Secondary | ICD-10-CM | POA: Diagnosis not present

## 2014-01-22 DIAGNOSIS — I472 Ventricular tachycardia: Secondary | ICD-10-CM | POA: Diagnosis not present

## 2014-01-22 DIAGNOSIS — Q2111 Secundum atrial septal defect: Secondary | ICD-10-CM | POA: Diagnosis not present

## 2014-01-22 DIAGNOSIS — R002 Palpitations: Secondary | ICD-10-CM | POA: Diagnosis not present

## 2014-01-26 DIAGNOSIS — R002 Palpitations: Secondary | ICD-10-CM | POA: Diagnosis not present

## 2014-01-26 DIAGNOSIS — I4729 Other ventricular tachycardia: Secondary | ICD-10-CM | POA: Diagnosis not present

## 2014-01-26 DIAGNOSIS — Q211 Atrial septal defect: Secondary | ICD-10-CM | POA: Diagnosis not present

## 2014-01-26 DIAGNOSIS — Q2111 Secundum atrial septal defect: Secondary | ICD-10-CM | POA: Diagnosis not present

## 2014-01-26 DIAGNOSIS — I059 Rheumatic mitral valve disease, unspecified: Secondary | ICD-10-CM | POA: Diagnosis not present

## 2014-01-26 DIAGNOSIS — Z5189 Encounter for other specified aftercare: Secondary | ICD-10-CM | POA: Diagnosis not present

## 2014-01-26 DIAGNOSIS — Z9889 Other specified postprocedural states: Secondary | ICD-10-CM | POA: Diagnosis not present

## 2014-01-26 DIAGNOSIS — I472 Ventricular tachycardia: Secondary | ICD-10-CM | POA: Diagnosis not present

## 2014-01-29 DIAGNOSIS — R002 Palpitations: Secondary | ICD-10-CM | POA: Diagnosis not present

## 2014-01-29 DIAGNOSIS — Q2111 Secundum atrial septal defect: Secondary | ICD-10-CM | POA: Diagnosis not present

## 2014-01-29 DIAGNOSIS — Z9889 Other specified postprocedural states: Secondary | ICD-10-CM | POA: Diagnosis not present

## 2014-01-29 DIAGNOSIS — I059 Rheumatic mitral valve disease, unspecified: Secondary | ICD-10-CM | POA: Diagnosis not present

## 2014-01-29 DIAGNOSIS — I4729 Other ventricular tachycardia: Secondary | ICD-10-CM | POA: Diagnosis not present

## 2014-01-29 DIAGNOSIS — Q211 Atrial septal defect: Secondary | ICD-10-CM | POA: Diagnosis not present

## 2014-01-29 DIAGNOSIS — I472 Ventricular tachycardia: Secondary | ICD-10-CM | POA: Diagnosis not present

## 2014-01-29 DIAGNOSIS — Z5189 Encounter for other specified aftercare: Secondary | ICD-10-CM | POA: Diagnosis not present

## 2014-02-05 DIAGNOSIS — I4729 Other ventricular tachycardia: Secondary | ICD-10-CM | POA: Diagnosis not present

## 2014-02-05 DIAGNOSIS — I472 Ventricular tachycardia: Secondary | ICD-10-CM | POA: Diagnosis not present

## 2014-02-05 DIAGNOSIS — Z5189 Encounter for other specified aftercare: Secondary | ICD-10-CM | POA: Diagnosis not present

## 2014-02-05 DIAGNOSIS — Z8774 Personal history of (corrected) congenital malformations of heart and circulatory system: Secondary | ICD-10-CM | POA: Diagnosis not present

## 2014-02-05 DIAGNOSIS — Z954 Presence of other heart-valve replacement: Secondary | ICD-10-CM | POA: Diagnosis not present

## 2014-02-09 DIAGNOSIS — Z8774 Personal history of (corrected) congenital malformations of heart and circulatory system: Secondary | ICD-10-CM | POA: Diagnosis not present

## 2014-02-09 DIAGNOSIS — Z5189 Encounter for other specified aftercare: Secondary | ICD-10-CM | POA: Diagnosis not present

## 2014-02-09 DIAGNOSIS — I4729 Other ventricular tachycardia: Secondary | ICD-10-CM | POA: Diagnosis not present

## 2014-02-09 DIAGNOSIS — I472 Ventricular tachycardia: Secondary | ICD-10-CM | POA: Diagnosis not present

## 2014-02-09 DIAGNOSIS — Z954 Presence of other heart-valve replacement: Secondary | ICD-10-CM | POA: Diagnosis not present

## 2014-02-10 DIAGNOSIS — Z5189 Encounter for other specified aftercare: Secondary | ICD-10-CM | POA: Diagnosis not present

## 2014-02-10 DIAGNOSIS — I4729 Other ventricular tachycardia: Secondary | ICD-10-CM | POA: Diagnosis not present

## 2014-02-10 DIAGNOSIS — Z954 Presence of other heart-valve replacement: Secondary | ICD-10-CM | POA: Diagnosis not present

## 2014-02-10 DIAGNOSIS — I472 Ventricular tachycardia: Secondary | ICD-10-CM | POA: Diagnosis not present

## 2014-02-10 DIAGNOSIS — Z8774 Personal history of (corrected) congenital malformations of heart and circulatory system: Secondary | ICD-10-CM | POA: Diagnosis not present

## 2014-02-12 DIAGNOSIS — I472 Ventricular tachycardia: Secondary | ICD-10-CM | POA: Diagnosis not present

## 2014-02-12 DIAGNOSIS — I4729 Other ventricular tachycardia: Secondary | ICD-10-CM | POA: Diagnosis not present

## 2014-02-12 DIAGNOSIS — Z8774 Personal history of (corrected) congenital malformations of heart and circulatory system: Secondary | ICD-10-CM | POA: Diagnosis not present

## 2014-02-12 DIAGNOSIS — Z5189 Encounter for other specified aftercare: Secondary | ICD-10-CM | POA: Diagnosis not present

## 2014-02-12 DIAGNOSIS — Z954 Presence of other heart-valve replacement: Secondary | ICD-10-CM | POA: Diagnosis not present

## 2014-02-17 DIAGNOSIS — Z8774 Personal history of (corrected) congenital malformations of heart and circulatory system: Secondary | ICD-10-CM | POA: Diagnosis not present

## 2014-02-17 DIAGNOSIS — I472 Ventricular tachycardia: Secondary | ICD-10-CM | POA: Diagnosis not present

## 2014-02-17 DIAGNOSIS — I4729 Other ventricular tachycardia: Secondary | ICD-10-CM | POA: Diagnosis not present

## 2014-02-17 DIAGNOSIS — Z5189 Encounter for other specified aftercare: Secondary | ICD-10-CM | POA: Diagnosis not present

## 2014-02-17 DIAGNOSIS — Z954 Presence of other heart-valve replacement: Secondary | ICD-10-CM | POA: Diagnosis not present

## 2014-02-19 DIAGNOSIS — Z5189 Encounter for other specified aftercare: Secondary | ICD-10-CM | POA: Diagnosis not present

## 2014-02-19 DIAGNOSIS — I472 Ventricular tachycardia: Secondary | ICD-10-CM | POA: Diagnosis not present

## 2014-02-19 DIAGNOSIS — I4729 Other ventricular tachycardia: Secondary | ICD-10-CM | POA: Diagnosis not present

## 2014-02-19 DIAGNOSIS — Z8774 Personal history of (corrected) congenital malformations of heart and circulatory system: Secondary | ICD-10-CM | POA: Diagnosis not present

## 2014-02-19 DIAGNOSIS — Z954 Presence of other heart-valve replacement: Secondary | ICD-10-CM | POA: Diagnosis not present

## 2014-02-23 DIAGNOSIS — Z5189 Encounter for other specified aftercare: Secondary | ICD-10-CM | POA: Diagnosis not present

## 2014-02-23 DIAGNOSIS — Z8774 Personal history of (corrected) congenital malformations of heart and circulatory system: Secondary | ICD-10-CM | POA: Diagnosis not present

## 2014-02-23 DIAGNOSIS — I472 Ventricular tachycardia: Secondary | ICD-10-CM | POA: Diagnosis not present

## 2014-02-23 DIAGNOSIS — Z954 Presence of other heart-valve replacement: Secondary | ICD-10-CM | POA: Diagnosis not present

## 2014-02-23 DIAGNOSIS — I4729 Other ventricular tachycardia: Secondary | ICD-10-CM | POA: Diagnosis not present

## 2014-02-24 DIAGNOSIS — I472 Ventricular tachycardia: Secondary | ICD-10-CM | POA: Diagnosis not present

## 2014-02-24 DIAGNOSIS — Z954 Presence of other heart-valve replacement: Secondary | ICD-10-CM | POA: Diagnosis not present

## 2014-02-24 DIAGNOSIS — I4729 Other ventricular tachycardia: Secondary | ICD-10-CM | POA: Diagnosis not present

## 2014-02-24 DIAGNOSIS — Z5189 Encounter for other specified aftercare: Secondary | ICD-10-CM | POA: Diagnosis not present

## 2014-02-24 DIAGNOSIS — Z8774 Personal history of (corrected) congenital malformations of heart and circulatory system: Secondary | ICD-10-CM | POA: Diagnosis not present

## 2014-02-26 DIAGNOSIS — I472 Ventricular tachycardia: Secondary | ICD-10-CM | POA: Diagnosis not present

## 2014-02-26 DIAGNOSIS — Z8774 Personal history of (corrected) congenital malformations of heart and circulatory system: Secondary | ICD-10-CM | POA: Diagnosis not present

## 2014-02-26 DIAGNOSIS — I4729 Other ventricular tachycardia: Secondary | ICD-10-CM | POA: Diagnosis not present

## 2014-02-26 DIAGNOSIS — Z5189 Encounter for other specified aftercare: Secondary | ICD-10-CM | POA: Diagnosis not present

## 2014-02-26 DIAGNOSIS — Z954 Presence of other heart-valve replacement: Secondary | ICD-10-CM | POA: Diagnosis not present

## 2014-03-02 DIAGNOSIS — I4729 Other ventricular tachycardia: Secondary | ICD-10-CM | POA: Diagnosis not present

## 2014-03-02 DIAGNOSIS — I472 Ventricular tachycardia: Secondary | ICD-10-CM | POA: Diagnosis not present

## 2014-03-02 DIAGNOSIS — Z954 Presence of other heart-valve replacement: Secondary | ICD-10-CM | POA: Diagnosis not present

## 2014-03-02 DIAGNOSIS — Z8774 Personal history of (corrected) congenital malformations of heart and circulatory system: Secondary | ICD-10-CM | POA: Diagnosis not present

## 2014-03-02 DIAGNOSIS — Z5189 Encounter for other specified aftercare: Secondary | ICD-10-CM | POA: Diagnosis not present

## 2014-03-03 DIAGNOSIS — I4891 Unspecified atrial fibrillation: Secondary | ICD-10-CM | POA: Diagnosis not present

## 2014-03-03 DIAGNOSIS — Z5189 Encounter for other specified aftercare: Secondary | ICD-10-CM | POA: Diagnosis not present

## 2014-03-03 DIAGNOSIS — Z8774 Personal history of (corrected) congenital malformations of heart and circulatory system: Secondary | ICD-10-CM | POA: Diagnosis not present

## 2014-03-03 DIAGNOSIS — Z7901 Long term (current) use of anticoagulants: Secondary | ICD-10-CM | POA: Diagnosis not present

## 2014-03-03 DIAGNOSIS — Z954 Presence of other heart-valve replacement: Secondary | ICD-10-CM | POA: Diagnosis not present

## 2014-03-05 DIAGNOSIS — Z954 Presence of other heart-valve replacement: Secondary | ICD-10-CM | POA: Diagnosis not present

## 2014-03-05 DIAGNOSIS — Z5189 Encounter for other specified aftercare: Secondary | ICD-10-CM | POA: Diagnosis not present

## 2014-03-05 DIAGNOSIS — Z7901 Long term (current) use of anticoagulants: Secondary | ICD-10-CM | POA: Diagnosis not present

## 2014-03-05 DIAGNOSIS — I4891 Unspecified atrial fibrillation: Secondary | ICD-10-CM | POA: Diagnosis not present

## 2014-03-05 DIAGNOSIS — Z8774 Personal history of (corrected) congenital malformations of heart and circulatory system: Secondary | ICD-10-CM | POA: Diagnosis not present

## 2014-03-09 DIAGNOSIS — I4891 Unspecified atrial fibrillation: Secondary | ICD-10-CM | POA: Diagnosis not present

## 2014-03-09 DIAGNOSIS — Z5189 Encounter for other specified aftercare: Secondary | ICD-10-CM | POA: Diagnosis not present

## 2014-03-09 DIAGNOSIS — Z7901 Long term (current) use of anticoagulants: Secondary | ICD-10-CM | POA: Diagnosis not present

## 2014-03-09 DIAGNOSIS — Z8774 Personal history of (corrected) congenital malformations of heart and circulatory system: Secondary | ICD-10-CM | POA: Diagnosis not present

## 2014-03-09 DIAGNOSIS — Z954 Presence of other heart-valve replacement: Secondary | ICD-10-CM | POA: Diagnosis not present

## 2014-03-10 DIAGNOSIS — Z8774 Personal history of (corrected) congenital malformations of heart and circulatory system: Secondary | ICD-10-CM | POA: Diagnosis not present

## 2014-03-10 DIAGNOSIS — I4891 Unspecified atrial fibrillation: Secondary | ICD-10-CM | POA: Diagnosis not present

## 2014-03-10 DIAGNOSIS — Z7901 Long term (current) use of anticoagulants: Secondary | ICD-10-CM | POA: Diagnosis not present

## 2014-03-10 DIAGNOSIS — Z954 Presence of other heart-valve replacement: Secondary | ICD-10-CM | POA: Diagnosis not present

## 2014-03-10 DIAGNOSIS — Z5189 Encounter for other specified aftercare: Secondary | ICD-10-CM | POA: Diagnosis not present

## 2014-03-16 DIAGNOSIS — Z7901 Long term (current) use of anticoagulants: Secondary | ICD-10-CM | POA: Diagnosis not present

## 2014-03-16 DIAGNOSIS — I4891 Unspecified atrial fibrillation: Secondary | ICD-10-CM | POA: Diagnosis not present

## 2014-03-16 DIAGNOSIS — Z5189 Encounter for other specified aftercare: Secondary | ICD-10-CM | POA: Diagnosis not present

## 2014-03-16 DIAGNOSIS — Z954 Presence of other heart-valve replacement: Secondary | ICD-10-CM | POA: Diagnosis not present

## 2014-03-16 DIAGNOSIS — Z8774 Personal history of (corrected) congenital malformations of heart and circulatory system: Secondary | ICD-10-CM | POA: Diagnosis not present

## 2014-03-17 DIAGNOSIS — Z7901 Long term (current) use of anticoagulants: Secondary | ICD-10-CM | POA: Diagnosis not present

## 2014-03-17 DIAGNOSIS — Z954 Presence of other heart-valve replacement: Secondary | ICD-10-CM | POA: Diagnosis not present

## 2014-03-17 DIAGNOSIS — Z8774 Personal history of (corrected) congenital malformations of heart and circulatory system: Secondary | ICD-10-CM | POA: Diagnosis not present

## 2014-03-17 DIAGNOSIS — I4891 Unspecified atrial fibrillation: Secondary | ICD-10-CM | POA: Diagnosis not present

## 2014-03-17 DIAGNOSIS — Z5189 Encounter for other specified aftercare: Secondary | ICD-10-CM | POA: Diagnosis not present

## 2014-03-18 DIAGNOSIS — I4891 Unspecified atrial fibrillation: Secondary | ICD-10-CM | POA: Diagnosis not present

## 2014-03-18 DIAGNOSIS — I059 Rheumatic mitral valve disease, unspecified: Secondary | ICD-10-CM | POA: Diagnosis not present

## 2014-03-18 DIAGNOSIS — R002 Palpitations: Secondary | ICD-10-CM | POA: Diagnosis not present

## 2014-03-26 DIAGNOSIS — Z8774 Personal history of (corrected) congenital malformations of heart and circulatory system: Secondary | ICD-10-CM | POA: Diagnosis not present

## 2014-03-26 DIAGNOSIS — Z954 Presence of other heart-valve replacement: Secondary | ICD-10-CM | POA: Diagnosis not present

## 2014-03-26 DIAGNOSIS — Z5189 Encounter for other specified aftercare: Secondary | ICD-10-CM | POA: Diagnosis not present

## 2014-03-26 DIAGNOSIS — I4891 Unspecified atrial fibrillation: Secondary | ICD-10-CM | POA: Diagnosis not present

## 2014-03-26 DIAGNOSIS — Z7901 Long term (current) use of anticoagulants: Secondary | ICD-10-CM | POA: Diagnosis not present

## 2014-03-30 DIAGNOSIS — Z7901 Long term (current) use of anticoagulants: Secondary | ICD-10-CM | POA: Diagnosis not present

## 2014-03-30 DIAGNOSIS — I4891 Unspecified atrial fibrillation: Secondary | ICD-10-CM | POA: Diagnosis not present

## 2014-03-30 DIAGNOSIS — Z5189 Encounter for other specified aftercare: Secondary | ICD-10-CM | POA: Diagnosis not present

## 2014-03-30 DIAGNOSIS — Z8774 Personal history of (corrected) congenital malformations of heart and circulatory system: Secondary | ICD-10-CM | POA: Diagnosis not present

## 2014-03-30 DIAGNOSIS — Z954 Presence of other heart-valve replacement: Secondary | ICD-10-CM | POA: Diagnosis not present

## 2014-03-31 DIAGNOSIS — I4891 Unspecified atrial fibrillation: Secondary | ICD-10-CM | POA: Diagnosis not present

## 2014-03-31 DIAGNOSIS — Z5189 Encounter for other specified aftercare: Secondary | ICD-10-CM | POA: Diagnosis not present

## 2014-03-31 DIAGNOSIS — Z8774 Personal history of (corrected) congenital malformations of heart and circulatory system: Secondary | ICD-10-CM | POA: Diagnosis not present

## 2014-03-31 DIAGNOSIS — Z954 Presence of other heart-valve replacement: Secondary | ICD-10-CM | POA: Diagnosis not present

## 2014-03-31 DIAGNOSIS — Z7901 Long term (current) use of anticoagulants: Secondary | ICD-10-CM | POA: Diagnosis not present

## 2014-04-02 DIAGNOSIS — Z7901 Long term (current) use of anticoagulants: Secondary | ICD-10-CM | POA: Diagnosis not present

## 2014-04-02 DIAGNOSIS — Z954 Presence of other heart-valve replacement: Secondary | ICD-10-CM | POA: Diagnosis not present

## 2014-04-02 DIAGNOSIS — I4891 Unspecified atrial fibrillation: Secondary | ICD-10-CM | POA: Diagnosis not present

## 2014-04-02 DIAGNOSIS — Z5189 Encounter for other specified aftercare: Secondary | ICD-10-CM | POA: Diagnosis not present

## 2014-04-02 DIAGNOSIS — Z8774 Personal history of (corrected) congenital malformations of heart and circulatory system: Secondary | ICD-10-CM | POA: Diagnosis not present

## 2014-04-06 DIAGNOSIS — Q2111 Secundum atrial septal defect: Secondary | ICD-10-CM | POA: Diagnosis not present

## 2014-04-06 DIAGNOSIS — Z9889 Other specified postprocedural states: Secondary | ICD-10-CM | POA: Diagnosis not present

## 2014-04-06 DIAGNOSIS — I059 Rheumatic mitral valve disease, unspecified: Secondary | ICD-10-CM | POA: Diagnosis not present

## 2014-04-06 DIAGNOSIS — R002 Palpitations: Secondary | ICD-10-CM | POA: Diagnosis not present

## 2014-04-06 DIAGNOSIS — Z5189 Encounter for other specified aftercare: Secondary | ICD-10-CM | POA: Diagnosis not present

## 2014-04-06 DIAGNOSIS — I4891 Unspecified atrial fibrillation: Secondary | ICD-10-CM | POA: Diagnosis not present

## 2014-04-06 DIAGNOSIS — Q211 Atrial septal defect: Secondary | ICD-10-CM | POA: Diagnosis not present

## 2014-04-07 DIAGNOSIS — I4891 Unspecified atrial fibrillation: Secondary | ICD-10-CM | POA: Diagnosis not present

## 2014-04-07 DIAGNOSIS — R002 Palpitations: Secondary | ICD-10-CM | POA: Diagnosis not present

## 2014-04-07 DIAGNOSIS — Q211 Atrial septal defect: Secondary | ICD-10-CM | POA: Diagnosis not present

## 2014-04-07 DIAGNOSIS — Z5189 Encounter for other specified aftercare: Secondary | ICD-10-CM | POA: Diagnosis not present

## 2014-04-07 DIAGNOSIS — I059 Rheumatic mitral valve disease, unspecified: Secondary | ICD-10-CM | POA: Diagnosis not present

## 2014-04-07 DIAGNOSIS — Q2111 Secundum atrial septal defect: Secondary | ICD-10-CM | POA: Diagnosis not present

## 2014-04-07 DIAGNOSIS — Z9889 Other specified postprocedural states: Secondary | ICD-10-CM | POA: Diagnosis not present

## 2014-04-08 DIAGNOSIS — M25519 Pain in unspecified shoulder: Secondary | ICD-10-CM | POA: Diagnosis not present

## 2014-04-08 DIAGNOSIS — M25569 Pain in unspecified knee: Secondary | ICD-10-CM | POA: Diagnosis not present

## 2014-04-09 DIAGNOSIS — Z9889 Other specified postprocedural states: Secondary | ICD-10-CM | POA: Diagnosis not present

## 2014-04-09 DIAGNOSIS — I4891 Unspecified atrial fibrillation: Secondary | ICD-10-CM | POA: Diagnosis not present

## 2014-04-09 DIAGNOSIS — Z5189 Encounter for other specified aftercare: Secondary | ICD-10-CM | POA: Diagnosis not present

## 2014-04-09 DIAGNOSIS — R002 Palpitations: Secondary | ICD-10-CM | POA: Diagnosis not present

## 2014-04-09 DIAGNOSIS — Q2111 Secundum atrial septal defect: Secondary | ICD-10-CM | POA: Diagnosis not present

## 2014-04-09 DIAGNOSIS — Q211 Atrial septal defect: Secondary | ICD-10-CM | POA: Diagnosis not present

## 2014-04-09 DIAGNOSIS — I059 Rheumatic mitral valve disease, unspecified: Secondary | ICD-10-CM | POA: Diagnosis not present

## 2014-04-13 DIAGNOSIS — I4891 Unspecified atrial fibrillation: Secondary | ICD-10-CM | POA: Diagnosis not present

## 2014-04-13 DIAGNOSIS — Q2111 Secundum atrial septal defect: Secondary | ICD-10-CM | POA: Diagnosis not present

## 2014-04-13 DIAGNOSIS — Z9889 Other specified postprocedural states: Secondary | ICD-10-CM | POA: Diagnosis not present

## 2014-04-13 DIAGNOSIS — R002 Palpitations: Secondary | ICD-10-CM | POA: Diagnosis not present

## 2014-04-13 DIAGNOSIS — Q211 Atrial septal defect: Secondary | ICD-10-CM | POA: Diagnosis not present

## 2014-04-13 DIAGNOSIS — I059 Rheumatic mitral valve disease, unspecified: Secondary | ICD-10-CM | POA: Diagnosis not present

## 2014-04-13 DIAGNOSIS — Z5189 Encounter for other specified aftercare: Secondary | ICD-10-CM | POA: Diagnosis not present

## 2014-04-14 DIAGNOSIS — I059 Rheumatic mitral valve disease, unspecified: Secondary | ICD-10-CM | POA: Diagnosis not present

## 2014-04-14 DIAGNOSIS — Z5189 Encounter for other specified aftercare: Secondary | ICD-10-CM | POA: Diagnosis not present

## 2014-04-14 DIAGNOSIS — R002 Palpitations: Secondary | ICD-10-CM | POA: Diagnosis not present

## 2014-04-14 DIAGNOSIS — Q211 Atrial septal defect: Secondary | ICD-10-CM | POA: Diagnosis not present

## 2014-04-14 DIAGNOSIS — Z9889 Other specified postprocedural states: Secondary | ICD-10-CM | POA: Diagnosis not present

## 2014-04-14 DIAGNOSIS — Q2111 Secundum atrial septal defect: Secondary | ICD-10-CM | POA: Diagnosis not present

## 2014-04-14 DIAGNOSIS — I4891 Unspecified atrial fibrillation: Secondary | ICD-10-CM | POA: Diagnosis not present

## 2014-04-16 DIAGNOSIS — Z9889 Other specified postprocedural states: Secondary | ICD-10-CM | POA: Diagnosis not present

## 2014-04-16 DIAGNOSIS — I4891 Unspecified atrial fibrillation: Secondary | ICD-10-CM | POA: Diagnosis not present

## 2014-04-16 DIAGNOSIS — H26499 Other secondary cataract, unspecified eye: Secondary | ICD-10-CM | POA: Diagnosis not present

## 2014-04-16 DIAGNOSIS — H04129 Dry eye syndrome of unspecified lacrimal gland: Secondary | ICD-10-CM | POA: Diagnosis not present

## 2014-04-16 DIAGNOSIS — Z5189 Encounter for other specified aftercare: Secondary | ICD-10-CM | POA: Diagnosis not present

## 2014-04-16 DIAGNOSIS — Q2111 Secundum atrial septal defect: Secondary | ICD-10-CM | POA: Diagnosis not present

## 2014-04-16 DIAGNOSIS — R002 Palpitations: Secondary | ICD-10-CM | POA: Diagnosis not present

## 2014-04-16 DIAGNOSIS — Z961 Presence of intraocular lens: Secondary | ICD-10-CM | POA: Diagnosis not present

## 2014-04-16 DIAGNOSIS — I059 Rheumatic mitral valve disease, unspecified: Secondary | ICD-10-CM | POA: Diagnosis not present

## 2014-04-16 DIAGNOSIS — H40019 Open angle with borderline findings, low risk, unspecified eye: Secondary | ICD-10-CM | POA: Diagnosis not present

## 2014-04-16 DIAGNOSIS — Q211 Atrial septal defect: Secondary | ICD-10-CM | POA: Diagnosis not present

## 2014-04-27 DIAGNOSIS — H26499 Other secondary cataract, unspecified eye: Secondary | ICD-10-CM | POA: Diagnosis not present

## 2014-06-11 DIAGNOSIS — Z23 Encounter for immunization: Secondary | ICD-10-CM | POA: Diagnosis not present

## 2014-06-17 DIAGNOSIS — M25561 Pain in right knee: Secondary | ICD-10-CM | POA: Diagnosis not present

## 2014-06-18 ENCOUNTER — Other Ambulatory Visit (HOSPITAL_BASED_OUTPATIENT_CLINIC_OR_DEPARTMENT_OTHER): Payer: Medicare Other | Admitting: Lab

## 2014-06-18 ENCOUNTER — Ambulatory Visit (HOSPITAL_BASED_OUTPATIENT_CLINIC_OR_DEPARTMENT_OTHER): Payer: Medicare Other | Admitting: Hematology & Oncology

## 2014-06-18 ENCOUNTER — Encounter: Payer: Self-pay | Admitting: Hematology & Oncology

## 2014-06-18 VITALS — BP 118/61 | HR 74 | Temp 98.1°F | Resp 14 | Ht 64.0 in | Wt 138.0 lb

## 2014-06-18 DIAGNOSIS — Z170 Estrogen receptor positive status [ER+]: Principal | ICD-10-CM

## 2014-06-18 DIAGNOSIS — C50912 Malignant neoplasm of unspecified site of left female breast: Secondary | ICD-10-CM

## 2014-06-18 DIAGNOSIS — Z853 Personal history of malignant neoplasm of breast: Secondary | ICD-10-CM

## 2014-06-18 LAB — CBC WITH DIFFERENTIAL (CANCER CENTER ONLY)
BASO#: 0 10*3/uL (ref 0.0–0.2)
BASO%: 0.1 % (ref 0.0–2.0)
EOS%: 0 % (ref 0.0–7.0)
Eosinophils Absolute: 0 10*3/uL (ref 0.0–0.5)
HCT: 36 % (ref 34.8–46.6)
HEMOGLOBIN: 12.5 g/dL (ref 11.6–15.9)
LYMPH#: 0.8 10*3/uL — AB (ref 0.9–3.3)
LYMPH%: 6.6 % — ABNORMAL LOW (ref 14.0–48.0)
MCH: 31.3 pg (ref 26.0–34.0)
MCHC: 34.7 g/dL (ref 32.0–36.0)
MCV: 90 fL (ref 81–101)
MONO#: 0.9 10*3/uL (ref 0.1–0.9)
MONO%: 6.8 % (ref 0.0–13.0)
NEUT%: 86.5 % — ABNORMAL HIGH (ref 39.6–80.0)
NEUTROS ABS: 10.9 10*3/uL — AB (ref 1.5–6.5)
PLATELETS: 229 10*3/uL (ref 145–400)
RBC: 3.99 10*6/uL (ref 3.70–5.32)
RDW: 12.8 % (ref 11.1–15.7)
WBC: 12.6 10*3/uL — ABNORMAL HIGH (ref 3.9–10.0)

## 2014-06-18 LAB — COMPREHENSIVE METABOLIC PANEL
ALBUMIN: 4.7 g/dL (ref 3.5–5.2)
ALK PHOS: 68 U/L (ref 39–117)
ALT: 22 U/L (ref 0–35)
AST: 27 U/L (ref 0–37)
BUN: 19 mg/dL (ref 6–23)
CALCIUM: 10.4 mg/dL (ref 8.4–10.5)
CHLORIDE: 102 meq/L (ref 96–112)
CO2: 26 mEq/L (ref 19–32)
Creatinine, Ser: 0.8 mg/dL (ref 0.50–1.10)
Glucose, Bld: 120 mg/dL — ABNORMAL HIGH (ref 70–99)
POTASSIUM: 4 meq/L (ref 3.5–5.3)
SODIUM: 137 meq/L (ref 135–145)
TOTAL PROTEIN: 7.2 g/dL (ref 6.0–8.3)
Total Bilirubin: 0.4 mg/dL (ref 0.2–1.2)

## 2014-06-20 NOTE — Progress Notes (Signed)
Hematology and Oncology Follow Up Visit  Katrina Holt [id number] July 19, 1946 68 y.o. 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  Current Therapy:    Observation     Interim History:  Ms.  Katrina Holt is back for followup. We see her yearly. In February, she had mitral valve repair. She got through surgery fairly well. He shows me a had a bypass at that time.  As far as her breast cancer is concerned, she is doing well with this. She's had no problems with bony pain. She's had no nausea or vomiting. She's had no change in bowel or bladder habits. There's been no rashes. She's had no leg swelling.  Medications: Current outpatient prescriptions:apixaban (ELIQUIS) 5 MG TABS tablet, Take 5 mg by mouth 2 (two) times daily. , Disp: , Rfl: ;  aspirin 81 MG tablet, Take 81 mg by mouth daily., Disp: , Rfl: ;  Biotin 5000 MCG TABS, Take by mouth every morning., Disp: , Rfl: ;  Calcium Carbonate-Vitamin D (CALCIUM 600+D) 600-200 MG-UNIT TABS, Take by mouth every morning., Disp: , Rfl:  diphenhydramine-acetaminophen (TYLENOL PM) 25-500 MG TABS, Take 1 tablet by mouth at bedtime as needed., Disp: , Rfl: ;  metoprolol succinate (TOPROL-XL) 25 MG 24 hr tablet, Take 25 mg by mouth daily. , Disp: , Rfl: ;  Multiple Vitamins-Minerals (MULTIVITAL PO), Take by mouth every morning., Disp: , Rfl: ;  Omega-3 Fatty Acids (FISH OIL) 1000 MG CAPS, Take by mouth every morning., Disp: , Rfl:  promethazine (PHENERGAN) 25 MG tablet, 25 mg as needed. , Disp: , Rfl: ;  valACYclovir (VALTREX) 1000 MG tablet, Take 1,000 mg by mouth as needed., Disp: , Rfl:   Allergies: No Known Allergies  Past Medical History, Surgical history, Social history, and Family History were reviewed and updated.  Review of Systems: As above  Physical Exam:  height is 5\' 4"  (1.626 m) and weight is 138 lb (62.596 kg). Her oral temperature is 98.1 F (36.7 C). Her blood pressure is 118/61 and her pulse is  74. Her respiration is 14.   Well-developed well-nourished white female in no obvious distress. Head and neck exam exam shows no ocular or oral lesions. Shows no palpable cervical or supraclavicular lymph nodes. Lungs are clear a lower. No rales wheezes or rhonchi are noted. Cardiac exam regular in rhythm. Shows no murmurs, rubs or bruits. Abdomen soft. Has good bowel sounds. There is no fluid wave. There is no palpable liver or spleen tip. Breast exam shows right breast with no masses, edema or erythema. There is no right axillary adenopathy. Left breast shows a well-healed lumpectomy at the 1:00 position. This was slight contraction of the lobectomy site. Note is a mass noted in the left breast. There is no left axillary adenopathy. Back exam shows no tenderness over the spine, ribs or hips. No kyphosis is noted. Extremities shows no clubbing, cyanosis or edema. Neurological exam shows no focal neurological deficits.    Lab Results  Component Value Date   WBC 12.6* 06/18/2014   HGB 12.5 06/18/2014   HCT 36.0 06/18/2014   MCV 90 06/18/2014   PLT 229 06/18/2014     Chemistry      Component Value Date/Time   NA 137 06/18/2014 0937   K 4.0 06/18/2014 0937   CL 102 06/18/2014 0937   CO2 26 06/18/2014 0937   BUN 19 06/18/2014 0937   CREATININE 0.80 06/18/2014 6237  Component Value Date/Time   CALCIUM 10.4 06/18/2014 0937   ALKPHOS 68 06/18/2014 0937   AST 27 06/18/2014 0937   ALT 22 06/18/2014 0937   BILITOT 0.4 06/18/2014 5364         Impression and Plan: [name] is 68 year old white female with a stage I carcinoma left breast. She underwent lumpectomy. She had radiation therapy. She was on Arimidex. She was on this for 8 years. Has now been 4 years she said she has been off her neck.  I don't see any evidence of recurrent disease.  Her last mammogram was done back in April of this year. There is a looked fine.  She is on ELIQUIS for atrial fibrillation which occurred  after her surgery. She is on baby aspirin.  We will see her back in one year.     Katrina Napoleon, MD 10/18/201512:45 PM

## 2014-07-23 DIAGNOSIS — Q211 Atrial septal defect: Secondary | ICD-10-CM | POA: Diagnosis not present

## 2014-07-23 DIAGNOSIS — I10 Essential (primary) hypertension: Secondary | ICD-10-CM | POA: Diagnosis not present

## 2014-07-23 DIAGNOSIS — R002 Palpitations: Secondary | ICD-10-CM | POA: Diagnosis not present

## 2014-07-23 DIAGNOSIS — R55 Syncope and collapse: Secondary | ICD-10-CM | POA: Diagnosis not present

## 2014-07-26 DIAGNOSIS — Z7901 Long term (current) use of anticoagulants: Secondary | ICD-10-CM | POA: Diagnosis not present

## 2014-07-26 DIAGNOSIS — J45909 Unspecified asthma, uncomplicated: Secondary | ICD-10-CM | POA: Diagnosis not present

## 2014-07-26 DIAGNOSIS — Z79899 Other long term (current) drug therapy: Secondary | ICD-10-CM | POA: Diagnosis not present

## 2014-07-26 DIAGNOSIS — R002 Palpitations: Secondary | ICD-10-CM | POA: Diagnosis not present

## 2014-07-26 DIAGNOSIS — Z853 Personal history of malignant neoplasm of breast: Secondary | ICD-10-CM | POA: Diagnosis not present

## 2014-07-26 DIAGNOSIS — I480 Paroxysmal atrial fibrillation: Secondary | ICD-10-CM | POA: Diagnosis not present

## 2014-07-27 DIAGNOSIS — I10 Essential (primary) hypertension: Secondary | ICD-10-CM | POA: Diagnosis not present

## 2014-07-27 DIAGNOSIS — R55 Syncope and collapse: Secondary | ICD-10-CM | POA: Diagnosis not present

## 2014-07-27 DIAGNOSIS — R002 Palpitations: Secondary | ICD-10-CM | POA: Diagnosis not present

## 2014-08-19 DIAGNOSIS — I480 Paroxysmal atrial fibrillation: Secondary | ICD-10-CM | POA: Diagnosis not present

## 2014-08-19 DIAGNOSIS — Z9889 Other specified postprocedural states: Secondary | ICD-10-CM | POA: Diagnosis not present

## 2014-08-19 DIAGNOSIS — Q211 Atrial septal defect: Secondary | ICD-10-CM | POA: Diagnosis not present

## 2014-08-19 DIAGNOSIS — I4892 Unspecified atrial flutter: Secondary | ICD-10-CM | POA: Diagnosis not present

## 2014-08-23 DIAGNOSIS — I10 Essential (primary) hypertension: Secondary | ICD-10-CM | POA: Diagnosis not present

## 2014-08-23 DIAGNOSIS — R002 Palpitations: Secondary | ICD-10-CM | POA: Diagnosis not present

## 2014-08-23 DIAGNOSIS — R55 Syncope and collapse: Secondary | ICD-10-CM | POA: Diagnosis not present

## 2014-09-07 DIAGNOSIS — I483 Typical atrial flutter: Secondary | ICD-10-CM | POA: Diagnosis not present

## 2014-09-07 DIAGNOSIS — I480 Paroxysmal atrial fibrillation: Secondary | ICD-10-CM | POA: Diagnosis not present

## 2014-09-27 DIAGNOSIS — Z7902 Long term (current) use of antithrombotics/antiplatelets: Secondary | ICD-10-CM | POA: Diagnosis not present

## 2014-09-27 DIAGNOSIS — I4892 Unspecified atrial flutter: Secondary | ICD-10-CM | POA: Diagnosis not present

## 2014-09-27 DIAGNOSIS — I480 Paroxysmal atrial fibrillation: Secondary | ICD-10-CM | POA: Diagnosis not present

## 2014-09-27 DIAGNOSIS — J45909 Unspecified asthma, uncomplicated: Secondary | ICD-10-CM | POA: Diagnosis not present

## 2014-09-27 DIAGNOSIS — Z79899 Other long term (current) drug therapy: Secondary | ICD-10-CM | POA: Diagnosis not present

## 2014-09-28 DIAGNOSIS — J45909 Unspecified asthma, uncomplicated: Secondary | ICD-10-CM | POA: Diagnosis not present

## 2014-09-28 DIAGNOSIS — I4892 Unspecified atrial flutter: Secondary | ICD-10-CM | POA: Diagnosis not present

## 2014-09-28 DIAGNOSIS — I441 Atrioventricular block, second degree: Secondary | ICD-10-CM | POA: Diagnosis not present

## 2014-09-28 DIAGNOSIS — I483 Typical atrial flutter: Secondary | ICD-10-CM | POA: Diagnosis not present

## 2014-09-28 DIAGNOSIS — Z7902 Long term (current) use of antithrombotics/antiplatelets: Secondary | ICD-10-CM | POA: Diagnosis not present

## 2014-09-28 DIAGNOSIS — Z79899 Other long term (current) drug therapy: Secondary | ICD-10-CM | POA: Diagnosis not present

## 2014-09-28 DIAGNOSIS — I480 Paroxysmal atrial fibrillation: Secondary | ICD-10-CM | POA: Diagnosis not present

## 2014-10-05 DIAGNOSIS — H524 Presbyopia: Secondary | ICD-10-CM | POA: Diagnosis not present

## 2014-10-05 DIAGNOSIS — Z961 Presence of intraocular lens: Secondary | ICD-10-CM | POA: Diagnosis not present

## 2014-10-21 DIAGNOSIS — R002 Palpitations: Secondary | ICD-10-CM | POA: Diagnosis not present

## 2014-11-01 DIAGNOSIS — Z01419 Encounter for gynecological examination (general) (routine) without abnormal findings: Secondary | ICD-10-CM | POA: Diagnosis not present

## 2014-11-01 DIAGNOSIS — Z1289 Encounter for screening for malignant neoplasm of other sites: Secondary | ICD-10-CM | POA: Diagnosis not present

## 2014-11-01 DIAGNOSIS — I4891 Unspecified atrial fibrillation: Secondary | ICD-10-CM | POA: Diagnosis not present

## 2014-11-01 DIAGNOSIS — E780 Pure hypercholesterolemia: Secondary | ICD-10-CM | POA: Diagnosis not present

## 2014-11-01 DIAGNOSIS — L659 Nonscarring hair loss, unspecified: Secondary | ICD-10-CM | POA: Diagnosis not present

## 2014-11-01 DIAGNOSIS — D649 Anemia, unspecified: Secondary | ICD-10-CM | POA: Diagnosis not present

## 2014-11-01 DIAGNOSIS — Z124 Encounter for screening for malignant neoplasm of cervix: Secondary | ICD-10-CM | POA: Diagnosis not present

## 2014-11-01 DIAGNOSIS — M8580 Other specified disorders of bone density and structure, unspecified site: Secondary | ICD-10-CM | POA: Diagnosis not present

## 2014-11-09 DIAGNOSIS — Z23 Encounter for immunization: Secondary | ICD-10-CM | POA: Diagnosis not present

## 2014-11-11 ENCOUNTER — Other Ambulatory Visit: Payer: Self-pay | Admitting: Family Medicine

## 2014-11-11 DIAGNOSIS — R002 Palpitations: Secondary | ICD-10-CM | POA: Diagnosis not present

## 2014-11-11 DIAGNOSIS — I483 Typical atrial flutter: Secondary | ICD-10-CM | POA: Diagnosis not present

## 2014-11-11 DIAGNOSIS — I493 Ventricular premature depolarization: Secondary | ICD-10-CM | POA: Diagnosis not present

## 2014-11-11 DIAGNOSIS — R5381 Other malaise: Secondary | ICD-10-CM

## 2014-11-16 ENCOUNTER — Other Ambulatory Visit: Payer: Self-pay

## 2014-11-16 DIAGNOSIS — Z1231 Encounter for screening mammogram for malignant neoplasm of breast: Secondary | ICD-10-CM

## 2014-11-18 DIAGNOSIS — I480 Paroxysmal atrial fibrillation: Secondary | ICD-10-CM | POA: Diagnosis not present

## 2014-11-18 DIAGNOSIS — I10 Essential (primary) hypertension: Secondary | ICD-10-CM | POA: Diagnosis not present

## 2014-11-18 DIAGNOSIS — Z9889 Other specified postprocedural states: Secondary | ICD-10-CM | POA: Diagnosis not present

## 2014-11-18 DIAGNOSIS — I493 Ventricular premature depolarization: Secondary | ICD-10-CM | POA: Diagnosis not present

## 2014-12-08 DIAGNOSIS — H40013 Open angle with borderline findings, low risk, bilateral: Secondary | ICD-10-CM | POA: Diagnosis not present

## 2014-12-17 ENCOUNTER — Ambulatory Visit
Admission: RE | Admit: 2014-12-17 | Discharge: 2014-12-17 | Disposition: A | Discharge status: Home / Self Care | Payer: Medicare Other | Source: Ambulatory Visit

## 2014-12-17 DIAGNOSIS — Z1231 Encounter for screening mammogram for malignant neoplasm of breast: Secondary | ICD-10-CM

## 2015-03-15 ENCOUNTER — Encounter: Payer: Self-pay | Admitting: Genetic Counselor

## 2015-03-31 ENCOUNTER — Telehealth: Payer: Self-pay | Admitting: Genetic Counselor

## 2015-03-31 NOTE — Telephone Encounter (Signed)
F/U Genetic Letter-Patient called to schedule f/u genetic appt and gave appt for 08/11 @ 10 w.Katrina Holt

## 2015-04-14 ENCOUNTER — Ambulatory Visit (HOSPITAL_BASED_OUTPATIENT_CLINIC_OR_DEPARTMENT_OTHER): Payer: Medicare Other | Admitting: Genetic Counselor

## 2015-04-14 ENCOUNTER — Encounter: Payer: Self-pay | Admitting: Genetic Counselor

## 2015-04-14 ENCOUNTER — Other Ambulatory Visit: Payer: Medicare Other

## 2015-04-14 DIAGNOSIS — Z315 Encounter for genetic counseling: Secondary | ICD-10-CM | POA: Diagnosis not present

## 2015-04-14 DIAGNOSIS — Z853 Personal history of malignant neoplasm of breast: Secondary | ICD-10-CM

## 2015-04-14 DIAGNOSIS — Z8041 Family history of malignant neoplasm of ovary: Secondary | ICD-10-CM

## 2015-04-14 DIAGNOSIS — Z809 Family history of malignant neoplasm, unspecified: Secondary | ICD-10-CM

## 2015-04-14 DIAGNOSIS — Z803 Family history of malignant neoplasm of breast: Secondary | ICD-10-CM | POA: Diagnosis not present

## 2015-04-14 DIAGNOSIS — Z801 Family history of malignant neoplasm of trachea, bronchus and lung: Secondary | ICD-10-CM

## 2015-04-14 DIAGNOSIS — Z8042 Family history of malignant neoplasm of prostate: Secondary | ICD-10-CM

## 2015-04-14 DIAGNOSIS — Z8052 Family history of malignant neoplasm of bladder: Secondary | ICD-10-CM

## 2015-04-14 NOTE — Progress Notes (Signed)
REFERRING PROVIDER: Burney Gauze, MD Irving Copas, MS, CGC  PRIMARY PROVIDER:  Finis Bud, MD  PRIMARY REASON FOR VISIT:  1. History of breast cancer in female   2. Family history of breast cancer in female   3. Family history of ovarian cancer   4. Family history of prostate cancer   5. Family history of bladder cancer   6. Family history of lung cancer   7. Family history of cancer      HISTORY OF PRESENT ILLNESS:   [name], a 69 y.o. female, was first seen for a [facility] cancer genetics consultation in 2006 because of a personal history of breast cancer and a family history of cancer.  Today she returns following receipt of a recall letter regarding updated genetic testing options.  Katrina Holt presents to clinic today to discuss the possibility of a hereditary predisposition to cancer, updated genetic testing options, and to further clarify her future cancer risks, as well as potential cancer risks for family members.   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 Hormone receptor status was ER+, PR-, and Her2-.  This was treated with left lumpectomy, radiation, and Arimidex for 8 years. Ms. Threats has had no new cancer diagnoses since that time.  CANCER HISTORY:   No history exists.     HORMONAL RISK FACTORS:  Menarche was at age 45-12.  First live birth at age 50.  OCP use for approximately 15 years.  Ovaries intact: yes.  Hysterectomy: no.  Menopausal status: postmenopausal.  HRT use: 3 years of Prempro Colonoscopy: yes; normal. Mammogram within the last year: yes. Number of breast biopsies: 1. Up to date with pelvic exams:  yes. Any excessive radiation exposure in the past:  Hx of CAT scans 2x per year for 3-4 years for lung evaluation  Past Medical History  Diagnosis Date  . Breast cancer 2006    IDC+DCIS of left breast; ER+, PR-, Her2-, ki67=4%    History reviewed. No pertinent past  surgical history.  Social History   Social History  . Marital Status: Married    Spouse Name: N/A  . Number of Children: N/A  . Years of Education: N/A   Social History Main Topics  . Smoking status: Never Smoker   . Smokeless tobacco: Never Used     Comment: never used tobacco  . Alcohol Use: 1.8 oz/week    3 Glasses of wine per week  . Drug Use: None  . Sexual Activity: Not Asked   Other Topics Concern  . None   Social History Narrative     FAMILY HISTORY:  We obtained a detailed, 4-generation family history.  Significant diagnoses are listed below: Family History  Problem Relation Age of Onset  . Colon polyps Mother     no concern  . Stroke Father   . Prostate cancer Brother 6    prostatectomy w/o treatment  . Prostate cancer Maternal Uncle     dx. 29s  . Heart Problems Paternal Uncle   . Bladder Cancer Maternal Grandfather     smoker  . Cancer Paternal Grandmother     unknown type - maybe stomach  . Stroke Paternal Grandfather   . Prostate cancer Maternal Uncle     dx. late 60s  . Ovarian cancer Maternal Aunt     fallopian tube cancer  . Breast cancer Maternal Aunt     dx. 4s  .  Lung cancer Paternal Uncle     dx. 50s  . Cancer Cousin     unknown type, but not breast or ovarian  . Breast cancer Cousin     dx. 40s-50s  . Bladder Cancer Cousin 94    dx. late 21s; smoker  . Ovarian cancer Cousin     dx. late 72s    Katrina Holt has one sone, age 91, and one daughter, age 3--both are cancer-free.  She currently has two granddaughters and one grandson.  She has one full brother, age 63, who was diagnosed with prostate cancer at 52.  He had a prostatectomy, but did not undergo treatments.  Katrina Holt mother died at 50, but was cancer-free.  Katrina Holt states that she did have a history of colon polyps (she is unaware of the number), but that it was not a concern for her mother.  Her father died at 67 of a stroke.    There is a maternal family history of  prostate, breast, ovarian/fallopian tube, and bladder cancer.  Katrina Holt mother had two full brothers and four full sisters.  Both brothers were diagnosed with prostate cancer in their 88s.  One sister was diagnosed with a fallopian tube cancer at 18.  This aunt had all sons, none of whom have had cancer.  Another aunt was diagnosed with breast cancer in her 89s.  The daughter of this aunt was diagnosed with breast cancer in her 10s-50s.  Another maternal first cousin (female) was diagnosed with an unknown type cancer in her 70s--a cancer which was not breast or ovarian.  Katrina Holt maternal grandmother died in her late 74s; her grandfather was a smoker who also died in his late 65s and had a history of bladder cancer.  There is a paternal history of cancer in one paternal uncle, diagnosed in his 91s (Katrina Holt father had five full brothers and two full sisters).  This uncle was a smoker.  His son was diagnosed with bladder cancer in his late 12s and was also a smoker.  The remaining uncles passed away from heart-related issues in their 24s-80s.  Both paternal aunts died in their 21s and were cancer-free.  One first cousin (a daughter of one paternal uncle) was diagnosed with ovarian cancer at 27.  She had no other siblings and she has one son and one daughter who are cancer-free.  Katrina Holt paternal grandmother died of an unknown cancer in her 54s (she thinks it may have been a stomach cancer primary, but is not sure).  Her paternal grandfather died in his early 37s of a stroke.  Patient's maternal ancestors are of [place] descent, and paternal ancestors are of [place] descent. There is no reported Ashkenazi Jewish ancestry. There is no known consanguinity.  GENETIC COUNSELING ASSESSMENT: KATHERINNE MOFIELD is a 69 y.o. female with a personal and family history of cancer which is somewhat suggestive of a hereditary cancer syndrome and predisposition to cancer. We, therefore, discussed and recommended  the following at today's visit.   DISCUSSION: We reviewed the characteristics, features and inheritance patterns of hereditary cancer syndromes. We also discussed genetic testing, including the appropriate family members to test, the process of testing, insurance coverage and turn-around-time for results. We discussed the implications of a negative, positive and/or variant of uncertain significant result. We recommended Ms. Skeet Simmer pursue genetic testing for the 24-gene OvaNext Panel through [employer] [facility] [place], [place]).  The OvaNext Panel offered by [facility] includes sequencing  and deletion/duplication analysis for the following 23 genes: ATM, BARD1, BRCA1, BRCA2, BRIP1, CDH1, CHEK2, MLH1, MRE11A, MSH2, MSH6, MUTYH, NBN, NF1, PALB2, PMS2, PTEN, RAD50, RAD51C, RAD51D, SMARCA4, STK11, and TP53.  This panel also includes deletion/duplication analysis (without sequencing) for one gene, EPCAM.  Based on Ms. Naab's personal and family history of cancer, she meets medical criteria for genetic testing. Despite that she meets criteria, she may still have an out of pocket cost. We discussed that if her out of pocket cost for testing is over $100, the laboratory will call and confirm whether she wants to proceed with testing.  If the out of pocket cost of testing is less than $100 she will be billed by the genetic testing laboratory.   PLAN: After considering the risks, benefits, and limitations, Ms. Hukill  provided informed consent to pursue genetic testing and the blood sample was sent to [facility] for analysis of the 24-gene OvaNext Panel test. Results should be available within approximately 3-4 weeks' time, at which point they will be disclosed by telephone to Ms. Christoph, as will any additional recommendations warranted by these results. Ms. Diliberto will receive a summary of her genetic counseling visit and a copy of her results once available. This information will also be  available in Epic. We encouraged Ms. Bendix to remain in contact with cancer genetics annually so that we can continuously update the family history and inform her of any changes in cancer genetics and testing that may be of benefit for her family. Ms. Schwenke questions were answered to her satisfaction today. Our contact information was provided should additional questions or concerns arise.  Thank you for the referral and allowing [place] to share in the care of your patient.   Jeanine Luz, MS Genetic Counselor [email or web address] Phone: [phone]  The patient was seen for a total of 60 minutes in face-to-face genetic counseling.  This patient was discussed with Drs. Magrinat, Lindi Adie and/or Burr Medico who agrees with the above.    _______________________________________________________________________ For Office Staff:  Number of people involved in session: 1 Was an Intern/ student involved with case: no

## 2015-05-04 ENCOUNTER — Telehealth: Payer: Self-pay | Admitting: Genetic Counselor

## 2015-05-04 ENCOUNTER — Ambulatory Visit: Payer: Self-pay | Admitting: Genetic Counselor

## 2015-05-04 DIAGNOSIS — Z1379 Encounter for other screening for genetic and chromosomal anomalies: Secondary | ICD-10-CM

## 2015-05-04 NOTE — Telephone Encounter (Signed)
Discussed with Ms. Houpt that her genetic test results were negative for mutations within any of 24 genes that would cause her to be at an increased risk for breast, ovarian, and other related cancers.  Additionally, no uncertain changes were found.  Thus, we still do not have an explanation for the family or personal history of cancer, however, this result may still be reassuring for [place] since most cancers are not genetic and Ms. Fallaw's parents lived into their 62s and 26s without having cancer.  We discussed that there are still some family members who would be eligible for genetic counseling and testing if interested.  Ms. Repetto brother might be eligible based on his personal history of prostate cancer and the family history of breast and prostate cancer.  It is unlikely that he would test positive, but would be an option for him.  Ms. Minahan maternal first cousin who was diagnosed with breast cancer in her 66s-50s and whose mother had breast cancer in her 13s would be eligible for genetic counseling and testing.  We also discussed that Ms. Ohmer's paternal first cousin who had ovarian cancer and her daughter both had negative genetic testing.  Women in the family would still be considered to be at a somewhat increased risk for breast cancer just based on the family history.  They should begin mammogram screening earlier--at 45 or 10 years younger than the earliest diagnosis of breast cancer.  If Ms. Utke daughter has not begun mammogram screening should could do that soon.  Ms. Bungert is welcome to check back in the future about updated testing options, but this is likely a reassuring result for [place].

## 2015-05-12 DIAGNOSIS — H524 Presbyopia: Secondary | ICD-10-CM | POA: Diagnosis not present

## 2015-05-12 DIAGNOSIS — H35373 Puckering of macula, bilateral: Secondary | ICD-10-CM | POA: Diagnosis not present

## 2015-05-12 DIAGNOSIS — H04123 Dry eye syndrome of bilateral lacrimal glands: Secondary | ICD-10-CM | POA: Diagnosis not present

## 2015-05-12 DIAGNOSIS — Z961 Presence of intraocular lens: Secondary | ICD-10-CM | POA: Diagnosis not present

## 2015-05-12 DIAGNOSIS — H40013 Open angle with borderline findings, low risk, bilateral: Secondary | ICD-10-CM | POA: Diagnosis not present

## 2015-05-13 DIAGNOSIS — Z1379 Encounter for other screening for genetic and chromosomal anomalies: Secondary | ICD-10-CM | POA: Insufficient documentation

## 2015-05-13 NOTE — Progress Notes (Signed)
GENETIC TEST RESULTS  HPI: Ms. Farnam was previously seen in the [facility] in 2006, at which time she had negative BRACAnalysis testing through [employer].  She returned to clinic recently for updated genetic testing. Please refer to our prior cancer genetics clinic note from April 14, 2015, for more information regarding Ms. Brocker's medical, social and family histories, and our assessment and recommendations, at the time. Ms. Salberg recent genetic test results were disclosed to her, as were recommendations warranted by these results. These results and recommendations are discussed in more detail below.  GENETIC TEST RESULTS: At the time of Ms. Siems's visit on 04/14/15, we recommended she pursue genetic testing of the 24-gene OvaNext Panel through [employer] [facility], [place]).  The OvaNext Panel offered by [employer] includes sequencing and deletion/duplication analysis of the following 23 genes: ATM, BARD1, BRCA1, BRCA2, BRIP1, CDH1, CHEK2, MLH1, MRE11A, MSH2, MSH6, MUTYH, NBN, NF1, PALB2, PMS2, PTEN, RAD50, RAD51C, RAD51D, SMARCA4, STK11, and TP53.  This panel also includes deletion/duplication analysis (without sequencing) for one gene, EPCAM.  Those results are now back, the report date for which is May 02, 2015.  Genetic testing was normal, and did not reveal a deleterious mutation in these genes.  Additionally, no variants of uncertain significance (VUSs) were found.  The test report will be scanned into EPIC and will be located under the Results Review tab in the Molecular Pathology section.   We discussed with Ms. Zuver that since the current genetic testing is not perfect, it is possible there may be a gene mutation in one of these genes that current testing cannot detect, but that chance is small. We also discussed, that it is possible that another gene that has not yet been discovered, or that we have not yet tested, is responsible for  the cancer diagnoses in the family, and it is, therefore, important to remain in touch with cancer genetics in the future so that we can continue to offer Ms. Stolarz the most up to date genetic testing.   CANCER SCREENING RECOMMENDATIONS: While we still do not have an explanation for the personal or family history of cancer, this result is reassuring and indicates that Ms. Strohman likely does not have an increased risk for a future cancer due to a mutation in one of these genes. This normal test also suggests that Ms. Duchesneau's cancer was most likely not due to an inherited predisposition associated with one of these genes.  Most cancers happen by chance and this negative test suggests that her cancer falls into this category.  Additionally, Ms. Stensland parents lived into their 45s and 89s without having cancer, so that may be reassuring as well.  We, therefore, recommended she continue to follow the cancer management and screening guidelines provided by her oncology and primary healthcare providers.   RECOMMENDATIONS FOR FAMILY MEMBERS: Women in this family might be at some increased risk of developing cancer, over the general population risk, simply due to the family history of cancer. We recommended women in this family have a yearly mammogram beginning at age 37, or 62 years younger than the earliest onset of cancer, an an annual clinical breast exam, and perform monthly breast self-exams. If Ms. Jernberg daughter has not begun mammogram screening, she could do that soon.  Women in this family should also have a gynecological exam as recommended by their primary provider. All family members should have a colonoscopy by age 35.  We discussed that  there are still some family members who would be eligible for genetic counseling and testing if interested.  Ms. Sockwell brother might be eligible based on his personal history of prostate cancer and the family history of breast and prostate cancer.  It is unlikely  that he would test positive, but this may be an option for him.  Ms. Heney maternal first cousin who was diagnosed with breast cancer in her 31s-50s and whose mother had breast cancer in her 51s would be eligible for genetic counseling and testing.  We also discussed the fact that Ms. Fiumara's paternal first cousin who had ovarian cancer and her daughter both had negative genetic testing.   Ms. Dumlao will let us know if we can be of any assistance in coordinating genetic counseling and/or testing for any of these family members.  FOLLOW-UP: Lastly, we discussed with Ms. Hollingworth that cancer genetics is a rapidly advancing field and it is possible that new genetic tests will be appropriate for her and/or her family members in the future. We encouraged her to remain in contact with cancer genetics on an annual basis so we can update her personal and family histories and let her know of advances in cancer genetics that may benefit this family.   Our contact number was provided. Ms. Jarnagin questions were answered to her satisfaction, and she knows she is welcome to call us at anytime with additional questions or concerns.   Jeanine Luz MS Genetic Counselor [email or web address] Phone: [phone]

## 2015-05-16 DIAGNOSIS — E780 Pure hypercholesterolemia: Secondary | ICD-10-CM | POA: Diagnosis not present

## 2015-05-16 DIAGNOSIS — D649 Anemia, unspecified: Secondary | ICD-10-CM | POA: Diagnosis not present

## 2015-05-16 DIAGNOSIS — M8580 Other specified disorders of bone density and structure, unspecified site: Secondary | ICD-10-CM | POA: Diagnosis not present

## 2015-05-16 DIAGNOSIS — J309 Allergic rhinitis, unspecified: Secondary | ICD-10-CM | POA: Diagnosis not present

## 2015-06-10 ENCOUNTER — Encounter (HOSPITAL_COMMUNITY): Payer: Self-pay

## 2015-06-12 DIAGNOSIS — Z23 Encounter for immunization: Secondary | ICD-10-CM | POA: Diagnosis not present

## 2015-06-17 ENCOUNTER — Other Ambulatory Visit: Payer: Medicare Other

## 2015-06-17 ENCOUNTER — Ambulatory Visit: Payer: Medicare Other | Admitting: Hematology & Oncology

## 2015-06-20 ENCOUNTER — Other Ambulatory Visit: Payer: Medicare Other

## 2015-06-20 ENCOUNTER — Ambulatory Visit: Payer: Medicare Other | Admitting: Hematology & Oncology

## 2015-06-21 ENCOUNTER — Other Ambulatory Visit: Payer: Self-pay | Admitting: *Deleted

## 2015-06-21 DIAGNOSIS — C50919 Malignant neoplasm of unspecified site of unspecified female breast: Secondary | ICD-10-CM

## 2015-06-21 DIAGNOSIS — Z1379 Encounter for other screening for genetic and chromosomal anomalies: Secondary | ICD-10-CM

## 2015-06-22 ENCOUNTER — Ambulatory Visit (HOSPITAL_BASED_OUTPATIENT_CLINIC_OR_DEPARTMENT_OTHER): Payer: Medicare Other | Admitting: Hematology & Oncology

## 2015-06-22 ENCOUNTER — Encounter: Payer: Self-pay | Admitting: Hematology & Oncology

## 2015-06-22 ENCOUNTER — Other Ambulatory Visit (HOSPITAL_BASED_OUTPATIENT_CLINIC_OR_DEPARTMENT_OTHER): Payer: Medicare Other

## 2015-06-22 VITALS — BP 148/82 | HR 59 | Temp 97.4°F | Resp 16 | Ht 64.0 in | Wt 139.0 lb

## 2015-06-22 DIAGNOSIS — Z853 Personal history of malignant neoplasm of breast: Secondary | ICD-10-CM

## 2015-06-22 DIAGNOSIS — C50919 Malignant neoplasm of unspecified site of unspecified female breast: Secondary | ICD-10-CM

## 2015-06-22 DIAGNOSIS — I4891 Unspecified atrial fibrillation: Secondary | ICD-10-CM

## 2015-06-22 DIAGNOSIS — C50012 Malignant neoplasm of nipple and areola, left female breast: Secondary | ICD-10-CM

## 2015-06-22 DIAGNOSIS — Z1379 Encounter for other screening for genetic and chromosomal anomalies: Secondary | ICD-10-CM

## 2015-06-22 LAB — CBC WITH DIFFERENTIAL (CANCER CENTER ONLY)
BASO#: 0 10*3/uL (ref 0.0–0.2)
BASO%: 0.5 % (ref 0.0–2.0)
EOS%: 1.7 % (ref 0.0–7.0)
Eosinophils Absolute: 0.1 10*3/uL (ref 0.0–0.5)
HEMATOCRIT: 40.4 % (ref 34.8–46.6)
HGB: 13.4 g/dL (ref 11.6–15.9)
LYMPH#: 1.1 10*3/uL (ref 0.9–3.3)
LYMPH%: 19 % (ref 14.0–48.0)
MCH: 30.1 pg (ref 26.0–34.0)
MCHC: 33.2 g/dL (ref 32.0–36.0)
MCV: 91 fL (ref 81–101)
MONO#: 0.7 10*3/uL (ref 0.1–0.9)
MONO%: 10.9 % (ref 0.0–13.0)
NEUT#: 4.1 10*3/uL (ref 1.5–6.5)
NEUT%: 67.9 % (ref 39.6–80.0)
Platelets: 180 10*3/uL (ref 145–400)
RBC: 4.45 10*6/uL (ref 3.70–5.32)
RDW: 12.9 % (ref 11.1–15.7)
WBC: 6 10*3/uL (ref 3.9–10.0)

## 2015-06-22 LAB — COMPREHENSIVE METABOLIC PANEL (CC13)
ALBUMIN: 4.2 g/dL (ref 3.5–5.0)
ALK PHOS: 66 U/L (ref 40–150)
ALT: 25 U/L (ref 0–55)
AST: 27 U/L (ref 5–34)
Anion Gap: 7 mEq/L (ref 3–11)
BILIRUBIN TOTAL: 0.53 mg/dL (ref 0.20–1.20)
BUN: 12.3 mg/dL (ref 7.0–26.0)
CALCIUM: 9.8 mg/dL (ref 8.4–10.4)
CO2: 30 mEq/L — ABNORMAL HIGH (ref 22–29)
CREATININE: 0.8 mg/dL (ref 0.6–1.1)
Chloride: 102 mEq/L (ref 98–109)
EGFR: 74 mL/min/{1.73_m2} — ABNORMAL LOW (ref >90)
GLUCOSE: 83 mg/dL (ref 70–140)
POTASSIUM: 4 meq/L (ref 3.5–5.1)
SODIUM: 139 meq/L (ref 136–145)
TOTAL PROTEIN: 7 g/dL (ref 6.4–8.3)

## 2015-06-22 NOTE — Progress Notes (Signed)
Hematology and Oncology Follow Up Visit  Katrina Holt [id number] 03-Jul-1946 69 y.o. 06/22/2015   Principle Diagnosis:  Stage I (T1c N0 M0) ductal carcinoma of the left breast.  Current Therapy:    Observation     Interim History:  Ms.  Holt is back for followup. We see her yearly. She feels well. She has had no problems if he last saw her.  She is on ELIQUIS for the atrial fibrillation.  Her last mammogram was done back in April. Everything looked okay.  She is worried about the cost of the [facility]. She is going to speak to her cardiologist deceiving given her some samples. I looked in our pharmacy and we did not have any samples.  She's had no palpable lymph glands. She's had no cough. She's had no fever. She's had no nausea or vomiting. There's not been any change in bowel or bladder habits. She's had no leg swelling. She's had no rashes.  Overall, her performance status is ECOG 1.    Medications:  Current outpatient prescriptions:  .  apixaban (ELIQUIS) 5 MG TABS tablet, Take 5 mg by mouth 2 (two) times daily. , Disp: , Rfl:  .  Biotin 5000 MCG TABS, Take by mouth every morning., Disp: , Rfl:  .  Calcium Carbonate-Vitamin D (CALCIUM 600+D) 600-200 MG-UNIT TABS, Take by mouth every morning., Disp: , Rfl:  .  metoprolol succinate (TOPROL-XL) 50 MG 24 hr tablet, , Disp: , Rfl:  .  Multiple Vitamins-Minerals (MULTIVITAL PO), Take by mouth every morning., Disp: , Rfl:  .  Omega-3 Fatty Acids (FISH OIL) 1000 MG CAPS, Take by mouth every morning., Disp: , Rfl:  .  valACYclovir (VALTREX) 1000 MG tablet, Take 1,000 mg by mouth as needed., Disp: , Rfl:   Allergies: No Known Allergies  Past Medical History, Surgical history, Social history, and Family History were reviewed and updated.  Review of Systems: As above  Physical Exam:  height is 5\' 4"  (1.626 m) and weight is 139 lb (63.05 kg). Her oral temperature is 97.4 F (36.3 C). Her blood pressure is 148/82 and her pulse is  59. Her respiration is 16.   Well-developed well-nourished white female in no obvious distress. Head and neck exam exam shows no ocular or oral lesions. Shows no palpable cervical or supraclavicular lymph nodes. Lungs are clear a lower. No rales wheezes or rhonchi are noted. Cardiac exam shows a regular rate and rhythm with no murmurs, rubs or bruits. Abdomen soft. Has good bowel sounds. There is no fluid wave. There is no palpable liver or spleen tip. Breast exam shows right breast with no masses, edema or erythema. There is no right axillary adenopathy. Left breast shows a well-healed lumpectomy at the 1:00 position. This was slight contraction of the lobectomy site. There is no mass noted in the left breast. There is no left axillary adenopathy. Back exam shows no tenderness over the spine, ribs or hips.  Slight kyphosis is noted. Extremities shows no clubbing, cyanosis or edema. Neurological exam shows no focal neurological deficits.    Lab Results  Component Value Date   WBC 6.0 06/22/2015   HGB 13.4 06/22/2015   HCT 40.4 06/22/2015   MCV 91 06/22/2015   PLT 180 06/22/2015     Chemistry      Component Value Date/Time   NA 137 06/18/2014 0937   K 4.0 06/18/2014 0937   CL 102 06/18/2014 0937   CO2 26 06/18/2014 0937   BUN 19  06/18/2014 0937   CREATININE 0.80 06/18/2014 0937      Component Value Date/Time   CALCIUM 10.4 06/18/2014 0937   ALKPHOS 68 06/18/2014 0937   AST 27 06/18/2014 0937   ALT 22 06/18/2014 0937   BILITOT 0.4 06/18/2014 9191         Impression and Plan: Katrina Holt is 69 year old white female with a stage I carcinoma left breast. She underwent lumpectomy. She had radiation therapy. She was on Arimidex. She was on this for 8 years. Has now been 5 years she said she has been off ..  I don't see any evidence of recurrent disease.  Her last mammogram was done back in April of this year. There is a looked fine.  She is on ELIQUIS for atrial fibrillation which  occurred after her surgery. She is on baby aspirin.  I think that we can let her go for the practice. I still do not think that her cancer is going to come back.  I told her that she can always come back at any time if she has any concerns.     Volanda Napoleon, MD 10/19/201611:34 AM

## 2015-06-23 ENCOUNTER — Telehealth: Payer: Self-pay | Admitting: Nurse Practitioner

## 2015-06-23 NOTE — Telephone Encounter (Signed)
-----   Message from Volanda Napoleon, MD sent at 06/22/2015  5:30 PM EDT ----- Call - labs look great!!  Potassium is ok!!  Calcium is ok!! pete

## 2015-10-12 DIAGNOSIS — H04123 Dry eye syndrome of bilateral lacrimal glands: Secondary | ICD-10-CM | POA: Diagnosis not present

## 2015-10-12 DIAGNOSIS — H40013 Open angle with borderline findings, low risk, bilateral: Secondary | ICD-10-CM | POA: Diagnosis not present

## 2015-10-12 DIAGNOSIS — H43813 Vitreous degeneration, bilateral: Secondary | ICD-10-CM | POA: Diagnosis not present

## 2015-10-12 DIAGNOSIS — H35373 Puckering of macula, bilateral: Secondary | ICD-10-CM | POA: Diagnosis not present

## 2015-11-14 ENCOUNTER — Other Ambulatory Visit: Payer: Self-pay

## 2015-11-14 DIAGNOSIS — Z1231 Encounter for screening mammogram for malignant neoplasm of breast: Secondary | ICD-10-CM

## 2015-11-22 DIAGNOSIS — Z9889 Other specified postprocedural states: Secondary | ICD-10-CM | POA: Diagnosis not present

## 2015-11-22 DIAGNOSIS — I10 Essential (primary) hypertension: Secondary | ICD-10-CM | POA: Diagnosis not present

## 2015-11-22 DIAGNOSIS — I493 Ventricular premature depolarization: Secondary | ICD-10-CM | POA: Diagnosis not present

## 2015-11-22 DIAGNOSIS — I480 Paroxysmal atrial fibrillation: Secondary | ICD-10-CM | POA: Diagnosis not present

## 2015-11-28 DIAGNOSIS — J309 Allergic rhinitis, unspecified: Secondary | ICD-10-CM | POA: Diagnosis not present

## 2015-11-28 DIAGNOSIS — Z1289 Encounter for screening for malignant neoplasm of other sites: Secondary | ICD-10-CM | POA: Diagnosis not present

## 2015-11-28 DIAGNOSIS — G479 Sleep disorder, unspecified: Secondary | ICD-10-CM | POA: Diagnosis not present

## 2015-11-28 DIAGNOSIS — L729 Follicular cyst of the skin and subcutaneous tissue, unspecified: Secondary | ICD-10-CM | POA: Diagnosis not present

## 2015-11-28 DIAGNOSIS — E7800 Pure hypercholesterolemia, unspecified: Secondary | ICD-10-CM | POA: Diagnosis not present

## 2015-11-28 DIAGNOSIS — Z0001 Encounter for general adult medical examination with abnormal findings: Secondary | ICD-10-CM | POA: Diagnosis not present

## 2015-11-30 ENCOUNTER — Other Ambulatory Visit: Payer: Self-pay | Admitting: Family Medicine

## 2015-11-30 DIAGNOSIS — M8580 Other specified disorders of bone density and structure, unspecified site: Secondary | ICD-10-CM

## 2015-11-30 DIAGNOSIS — Z780 Asymptomatic menopausal state: Secondary | ICD-10-CM

## 2015-12-13 DIAGNOSIS — H40013 Open angle with borderline findings, low risk, bilateral: Secondary | ICD-10-CM | POA: Diagnosis not present

## 2015-12-16 ENCOUNTER — Ambulatory Visit
Admission: RE | Admit: 2015-12-16 | Discharge: 2015-12-16 | Disposition: A | Discharge status: Home / Self Care | Payer: Medicare Other | Source: Ambulatory Visit | Attending: Family Medicine | Admitting: Family Medicine

## 2015-12-16 DIAGNOSIS — Z780 Asymptomatic menopausal state: Secondary | ICD-10-CM | POA: Diagnosis not present

## 2015-12-16 DIAGNOSIS — M8580 Other specified disorders of bone density and structure, unspecified site: Secondary | ICD-10-CM

## 2015-12-16 DIAGNOSIS — M85851 Other specified disorders of bone density and structure, right thigh: Secondary | ICD-10-CM | POA: Diagnosis not present

## 2015-12-19 ENCOUNTER — Ambulatory Visit: Payer: Medicare Other

## 2015-12-19 ENCOUNTER — Ambulatory Visit
Admission: RE | Admit: 2015-12-19 | Discharge: 2015-12-19 | Disposition: A | Discharge status: Home / Self Care | Payer: Medicare Other | Source: Ambulatory Visit

## 2015-12-19 DIAGNOSIS — Z1231 Encounter for screening mammogram for malignant neoplasm of breast: Secondary | ICD-10-CM

## 2015-12-20 DIAGNOSIS — M25511 Pain in right shoulder: Secondary | ICD-10-CM | POA: Diagnosis not present

## 2015-12-26 ENCOUNTER — Ambulatory Visit: Payer: Medicare Other

## 2016-01-16 DIAGNOSIS — M25511 Pain in right shoulder: Secondary | ICD-10-CM | POA: Diagnosis not present

## 2016-01-16 DIAGNOSIS — M25611 Stiffness of right shoulder, not elsewhere classified: Secondary | ICD-10-CM | POA: Diagnosis not present

## 2016-01-16 DIAGNOSIS — R29898 Other symptoms and signs involving the musculoskeletal system: Secondary | ICD-10-CM | POA: Diagnosis not present

## 2016-01-24 DIAGNOSIS — R29898 Other symptoms and signs involving the musculoskeletal system: Secondary | ICD-10-CM | POA: Diagnosis not present

## 2016-01-24 DIAGNOSIS — M25511 Pain in right shoulder: Secondary | ICD-10-CM | POA: Diagnosis not present

## 2016-01-24 DIAGNOSIS — M25611 Stiffness of right shoulder, not elsewhere classified: Secondary | ICD-10-CM | POA: Diagnosis not present

## 2016-01-31 DIAGNOSIS — M25511 Pain in right shoulder: Secondary | ICD-10-CM | POA: Diagnosis not present

## 2016-01-31 DIAGNOSIS — M25611 Stiffness of right shoulder, not elsewhere classified: Secondary | ICD-10-CM | POA: Diagnosis not present

## 2016-01-31 DIAGNOSIS — R29898 Other symptoms and signs involving the musculoskeletal system: Secondary | ICD-10-CM | POA: Diagnosis not present

## 2016-02-01 DIAGNOSIS — L218 Other seborrheic dermatitis: Secondary | ICD-10-CM | POA: Diagnosis not present

## 2016-02-06 DIAGNOSIS — R29898 Other symptoms and signs involving the musculoskeletal system: Secondary | ICD-10-CM | POA: Diagnosis not present

## 2016-02-06 DIAGNOSIS — M25511 Pain in right shoulder: Secondary | ICD-10-CM | POA: Diagnosis not present

## 2016-02-06 DIAGNOSIS — M25611 Stiffness of right shoulder, not elsewhere classified: Secondary | ICD-10-CM | POA: Diagnosis not present

## 2016-02-20 DIAGNOSIS — M25511 Pain in right shoulder: Secondary | ICD-10-CM | POA: Diagnosis not present

## 2016-02-20 DIAGNOSIS — M25611 Stiffness of right shoulder, not elsewhere classified: Secondary | ICD-10-CM | POA: Diagnosis not present

## 2016-02-20 DIAGNOSIS — R29898 Other symptoms and signs involving the musculoskeletal system: Secondary | ICD-10-CM | POA: Diagnosis not present

## 2016-04-25 ENCOUNTER — Encounter (HOSPITAL_COMMUNITY): Payer: Self-pay

## 2016-06-07 DIAGNOSIS — F519 Sleep disorder not due to a substance or known physiological condition, unspecified: Secondary | ICD-10-CM | POA: Diagnosis not present

## 2016-06-07 DIAGNOSIS — R5383 Other fatigue: Secondary | ICD-10-CM | POA: Diagnosis not present

## 2016-06-07 DIAGNOSIS — J309 Allergic rhinitis, unspecified: Secondary | ICD-10-CM | POA: Diagnosis not present

## 2016-06-07 DIAGNOSIS — Z23 Encounter for immunization: Secondary | ICD-10-CM | POA: Diagnosis not present

## 2016-06-07 DIAGNOSIS — E7800 Pure hypercholesterolemia, unspecified: Secondary | ICD-10-CM | POA: Diagnosis not present

## 2016-06-07 DIAGNOSIS — I482 Chronic atrial fibrillation: Secondary | ICD-10-CM | POA: Diagnosis not present

## 2016-06-07 DIAGNOSIS — M8580 Other specified disorders of bone density and structure, unspecified site: Secondary | ICD-10-CM | POA: Diagnosis not present

## 2016-08-31 DIAGNOSIS — R002 Palpitations: Secondary | ICD-10-CM | POA: Diagnosis not present

## 2016-09-24 DIAGNOSIS — I4892 Unspecified atrial flutter: Secondary | ICD-10-CM | POA: Diagnosis not present

## 2016-09-24 DIAGNOSIS — J45909 Unspecified asthma, uncomplicated: Secondary | ICD-10-CM | POA: Diagnosis not present

## 2016-09-24 DIAGNOSIS — R002 Palpitations: Secondary | ICD-10-CM | POA: Diagnosis not present

## 2016-09-24 DIAGNOSIS — R000 Tachycardia, unspecified: Secondary | ICD-10-CM | POA: Diagnosis not present

## 2016-09-24 DIAGNOSIS — I10 Essential (primary) hypertension: Secondary | ICD-10-CM | POA: Diagnosis not present

## 2016-09-24 DIAGNOSIS — Z7901 Long term (current) use of anticoagulants: Secondary | ICD-10-CM | POA: Diagnosis not present

## 2016-09-24 DIAGNOSIS — Z79899 Other long term (current) drug therapy: Secondary | ICD-10-CM | POA: Diagnosis not present

## 2016-09-24 DIAGNOSIS — R05 Cough: Secondary | ICD-10-CM | POA: Diagnosis not present

## 2016-09-27 DIAGNOSIS — I480 Paroxysmal atrial fibrillation: Secondary | ICD-10-CM | POA: Diagnosis not present

## 2016-09-27 DIAGNOSIS — I10 Essential (primary) hypertension: Secondary | ICD-10-CM | POA: Diagnosis not present

## 2016-09-27 DIAGNOSIS — Z9889 Other specified postprocedural states: Secondary | ICD-10-CM | POA: Diagnosis not present

## 2016-10-05 DIAGNOSIS — J9801 Acute bronchospasm: Secondary | ICD-10-CM | POA: Diagnosis not present

## 2016-10-05 DIAGNOSIS — J069 Acute upper respiratory infection, unspecified: Secondary | ICD-10-CM | POA: Diagnosis not present

## 2016-10-10 DIAGNOSIS — Q211 Atrial septal defect: Secondary | ICD-10-CM | POA: Diagnosis not present

## 2016-10-10 DIAGNOSIS — I10 Essential (primary) hypertension: Secondary | ICD-10-CM | POA: Diagnosis not present

## 2016-10-10 DIAGNOSIS — I483 Typical atrial flutter: Secondary | ICD-10-CM | POA: Diagnosis not present

## 2016-10-10 DIAGNOSIS — I480 Paroxysmal atrial fibrillation: Secondary | ICD-10-CM | POA: Diagnosis not present

## 2016-10-16 DIAGNOSIS — H43813 Vitreous degeneration, bilateral: Secondary | ICD-10-CM | POA: Diagnosis not present

## 2016-10-16 DIAGNOSIS — H04123 Dry eye syndrome of bilateral lacrimal glands: Secondary | ICD-10-CM | POA: Diagnosis not present

## 2016-10-16 DIAGNOSIS — H40013 Open angle with borderline findings, low risk, bilateral: Secondary | ICD-10-CM | POA: Diagnosis not present

## 2016-10-16 DIAGNOSIS — Z961 Presence of intraocular lens: Secondary | ICD-10-CM | POA: Diagnosis not present

## 2016-11-14 ENCOUNTER — Other Ambulatory Visit: Payer: Self-pay | Admitting: Unknown Physician Specialty

## 2016-11-14 DIAGNOSIS — Z1231 Encounter for screening mammogram for malignant neoplasm of breast: Secondary | ICD-10-CM

## 2016-11-19 DIAGNOSIS — R05 Cough: Secondary | ICD-10-CM | POA: Diagnosis not present

## 2016-11-19 DIAGNOSIS — I480 Paroxysmal atrial fibrillation: Secondary | ICD-10-CM | POA: Diagnosis not present

## 2016-11-19 DIAGNOSIS — I10 Essential (primary) hypertension: Secondary | ICD-10-CM | POA: Diagnosis not present

## 2016-11-22 DIAGNOSIS — I483 Typical atrial flutter: Secondary | ICD-10-CM | POA: Diagnosis not present

## 2016-11-22 DIAGNOSIS — Z9889 Other specified postprocedural states: Secondary | ICD-10-CM | POA: Diagnosis not present

## 2016-11-22 DIAGNOSIS — I10 Essential (primary) hypertension: Secondary | ICD-10-CM | POA: Diagnosis not present

## 2016-11-22 DIAGNOSIS — I480 Paroxysmal atrial fibrillation: Secondary | ICD-10-CM | POA: Diagnosis not present

## 2016-12-07 DIAGNOSIS — M8580 Other specified disorders of bone density and structure, unspecified site: Secondary | ICD-10-CM | POA: Diagnosis not present

## 2016-12-07 DIAGNOSIS — Z0000 Encounter for general adult medical examination without abnormal findings: Secondary | ICD-10-CM | POA: Diagnosis not present

## 2016-12-07 DIAGNOSIS — D649 Anemia, unspecified: Secondary | ICD-10-CM | POA: Diagnosis not present

## 2016-12-07 DIAGNOSIS — I480 Paroxysmal atrial fibrillation: Secondary | ICD-10-CM | POA: Diagnosis not present

## 2016-12-07 DIAGNOSIS — Z1231 Encounter for screening mammogram for malignant neoplasm of breast: Secondary | ICD-10-CM | POA: Diagnosis not present

## 2016-12-07 DIAGNOSIS — M899 Disorder of bone, unspecified: Secondary | ICD-10-CM | POA: Diagnosis not present

## 2016-12-07 DIAGNOSIS — R5383 Other fatigue: Secondary | ICD-10-CM | POA: Diagnosis not present

## 2016-12-07 DIAGNOSIS — E7800 Pure hypercholesterolemia, unspecified: Secondary | ICD-10-CM | POA: Diagnosis not present

## 2016-12-19 ENCOUNTER — Ambulatory Visit
Admission: RE | Admit: 2016-12-19 | Discharge: 2016-12-19 | Disposition: A | Discharge status: Home / Self Care | Payer: Medicare Other | Source: Ambulatory Visit | Attending: Unknown Physician Specialty | Admitting: Unknown Physician Specialty

## 2016-12-19 DIAGNOSIS — Z1231 Encounter for screening mammogram for malignant neoplasm of breast: Secondary | ICD-10-CM

## 2016-12-20 DIAGNOSIS — R52 Pain, unspecified: Secondary | ICD-10-CM | POA: Diagnosis not present

## 2016-12-20 DIAGNOSIS — R05 Cough: Secondary | ICD-10-CM | POA: Diagnosis not present

## 2016-12-20 DIAGNOSIS — R509 Fever, unspecified: Secondary | ICD-10-CM | POA: Diagnosis not present

## 2016-12-20 DIAGNOSIS — J029 Acute pharyngitis, unspecified: Secondary | ICD-10-CM | POA: Diagnosis not present

## 2016-12-24 DIAGNOSIS — I480 Paroxysmal atrial fibrillation: Secondary | ICD-10-CM | POA: Diagnosis not present

## 2016-12-24 DIAGNOSIS — I10 Essential (primary) hypertension: Secondary | ICD-10-CM | POA: Diagnosis not present

## 2016-12-24 DIAGNOSIS — I483 Typical atrial flutter: Secondary | ICD-10-CM | POA: Diagnosis not present

## 2016-12-24 DIAGNOSIS — Z9889 Other specified postprocedural states: Secondary | ICD-10-CM | POA: Diagnosis not present

## 2016-12-31 DIAGNOSIS — I480 Paroxysmal atrial fibrillation: Secondary | ICD-10-CM | POA: Diagnosis not present

## 2016-12-31 DIAGNOSIS — Z9889 Other specified postprocedural states: Secondary | ICD-10-CM | POA: Diagnosis not present

## 2016-12-31 DIAGNOSIS — I483 Typical atrial flutter: Secondary | ICD-10-CM | POA: Diagnosis not present

## 2016-12-31 DIAGNOSIS — Z7951 Long term (current) use of inhaled steroids: Secondary | ICD-10-CM | POA: Diagnosis not present

## 2016-12-31 DIAGNOSIS — I4581 Long QT syndrome: Secondary | ICD-10-CM | POA: Diagnosis present

## 2016-12-31 DIAGNOSIS — Z598 Other problems related to housing and economic circumstances: Secondary | ICD-10-CM | POA: Diagnosis not present

## 2016-12-31 DIAGNOSIS — F329 Major depressive disorder, single episode, unspecified: Secondary | ICD-10-CM | POA: Diagnosis present

## 2016-12-31 DIAGNOSIS — Z853 Personal history of malignant neoplasm of breast: Secondary | ICD-10-CM | POA: Diagnosis not present

## 2016-12-31 DIAGNOSIS — F419 Anxiety disorder, unspecified: Secondary | ICD-10-CM | POA: Diagnosis present

## 2016-12-31 DIAGNOSIS — J45909 Unspecified asthma, uncomplicated: Secondary | ICD-10-CM | POA: Diagnosis present

## 2016-12-31 DIAGNOSIS — Z7901 Long term (current) use of anticoagulants: Secondary | ICD-10-CM | POA: Diagnosis not present

## 2016-12-31 DIAGNOSIS — I10 Essential (primary) hypertension: Secondary | ICD-10-CM | POA: Diagnosis present

## 2017-01-10 DIAGNOSIS — I483 Typical atrial flutter: Secondary | ICD-10-CM | POA: Diagnosis not present

## 2017-01-10 DIAGNOSIS — I10 Essential (primary) hypertension: Secondary | ICD-10-CM | POA: Diagnosis not present

## 2017-01-10 DIAGNOSIS — Q211 Atrial septal defect: Secondary | ICD-10-CM | POA: Diagnosis not present

## 2017-01-10 DIAGNOSIS — I480 Paroxysmal atrial fibrillation: Secondary | ICD-10-CM | POA: Diagnosis not present

## 2017-01-10 DIAGNOSIS — Z9889 Other specified postprocedural states: Secondary | ICD-10-CM | POA: Diagnosis not present

## 2017-01-30 DIAGNOSIS — I10 Essential (primary) hypertension: Secondary | ICD-10-CM | POA: Diagnosis not present

## 2017-01-30 DIAGNOSIS — R001 Bradycardia, unspecified: Secondary | ICD-10-CM | POA: Diagnosis not present

## 2017-01-30 DIAGNOSIS — I480 Paroxysmal atrial fibrillation: Secondary | ICD-10-CM | POA: Diagnosis not present

## 2017-01-30 DIAGNOSIS — I483 Typical atrial flutter: Secondary | ICD-10-CM | POA: Diagnosis not present

## 2017-02-04 DIAGNOSIS — M25561 Pain in right knee: Secondary | ICD-10-CM | POA: Diagnosis not present

## 2017-03-27 DIAGNOSIS — M79642 Pain in left hand: Secondary | ICD-10-CM | POA: Diagnosis not present

## 2017-03-27 DIAGNOSIS — M79671 Pain in right foot: Secondary | ICD-10-CM | POA: Diagnosis not present

## 2017-03-27 DIAGNOSIS — M79641 Pain in right hand: Secondary | ICD-10-CM | POA: Diagnosis not present

## 2017-04-11 DIAGNOSIS — Z853 Personal history of malignant neoplasm of breast: Secondary | ICD-10-CM | POA: Diagnosis not present

## 2017-04-11 DIAGNOSIS — I480 Paroxysmal atrial fibrillation: Secondary | ICD-10-CM | POA: Diagnosis not present

## 2017-04-11 DIAGNOSIS — I341 Nonrheumatic mitral (valve) prolapse: Secondary | ICD-10-CM | POA: Diagnosis not present

## 2017-04-11 DIAGNOSIS — Z9849 Cataract extraction status, unspecified eye: Secondary | ICD-10-CM | POA: Diagnosis not present

## 2017-04-11 DIAGNOSIS — Z7901 Long term (current) use of anticoagulants: Secondary | ICD-10-CM | POA: Diagnosis not present

## 2017-04-11 DIAGNOSIS — J45909 Unspecified asthma, uncomplicated: Secondary | ICD-10-CM | POA: Diagnosis not present

## 2017-04-11 DIAGNOSIS — K219 Gastro-esophageal reflux disease without esophagitis: Secondary | ICD-10-CM | POA: Diagnosis not present

## 2017-04-11 DIAGNOSIS — I10 Essential (primary) hypertension: Secondary | ICD-10-CM | POA: Diagnosis not present

## 2017-04-11 DIAGNOSIS — I483 Typical atrial flutter: Secondary | ICD-10-CM | POA: Diagnosis not present

## 2017-04-11 DIAGNOSIS — Z79899 Other long term (current) drug therapy: Secondary | ICD-10-CM | POA: Diagnosis not present

## 2017-04-12 DIAGNOSIS — R001 Bradycardia, unspecified: Secondary | ICD-10-CM | POA: Diagnosis not present

## 2017-04-12 DIAGNOSIS — I480 Paroxysmal atrial fibrillation: Secondary | ICD-10-CM | POA: Diagnosis not present

## 2017-04-12 DIAGNOSIS — J45909 Unspecified asthma, uncomplicated: Secondary | ICD-10-CM | POA: Diagnosis not present

## 2017-04-12 DIAGNOSIS — K219 Gastro-esophageal reflux disease without esophagitis: Secondary | ICD-10-CM | POA: Diagnosis not present

## 2017-04-12 DIAGNOSIS — I341 Nonrheumatic mitral (valve) prolapse: Secondary | ICD-10-CM | POA: Diagnosis not present

## 2017-04-12 DIAGNOSIS — I483 Typical atrial flutter: Secondary | ICD-10-CM | POA: Diagnosis not present

## 2017-04-12 DIAGNOSIS — I10 Essential (primary) hypertension: Secondary | ICD-10-CM | POA: Diagnosis not present

## 2017-04-15 DIAGNOSIS — H40013 Open angle with borderline findings, low risk, bilateral: Secondary | ICD-10-CM | POA: Diagnosis not present

## 2017-04-15 DIAGNOSIS — H04123 Dry eye syndrome of bilateral lacrimal glands: Secondary | ICD-10-CM | POA: Diagnosis not present

## 2017-04-29 DIAGNOSIS — R001 Bradycardia, unspecified: Secondary | ICD-10-CM | POA: Diagnosis not present

## 2017-04-29 DIAGNOSIS — I10 Essential (primary) hypertension: Secondary | ICD-10-CM | POA: Diagnosis not present

## 2017-04-29 DIAGNOSIS — Z9889 Other specified postprocedural states: Secondary | ICD-10-CM | POA: Diagnosis not present

## 2017-04-29 DIAGNOSIS — I480 Paroxysmal atrial fibrillation: Secondary | ICD-10-CM | POA: Diagnosis not present

## 2017-04-29 DIAGNOSIS — I493 Ventricular premature depolarization: Secondary | ICD-10-CM | POA: Diagnosis not present

## 2017-04-29 DIAGNOSIS — I483 Typical atrial flutter: Secondary | ICD-10-CM | POA: Diagnosis not present

## 2017-05-28 DIAGNOSIS — Z9889 Other specified postprocedural states: Secondary | ICD-10-CM | POA: Diagnosis not present

## 2017-05-28 DIAGNOSIS — I483 Typical atrial flutter: Secondary | ICD-10-CM | POA: Diagnosis not present

## 2017-05-28 DIAGNOSIS — I340 Nonrheumatic mitral (valve) insufficiency: Secondary | ICD-10-CM | POA: Diagnosis not present

## 2017-05-28 DIAGNOSIS — I480 Paroxysmal atrial fibrillation: Secondary | ICD-10-CM | POA: Diagnosis not present

## 2017-05-28 DIAGNOSIS — I519 Heart disease, unspecified: Secondary | ICD-10-CM | POA: Diagnosis not present

## 2017-05-28 DIAGNOSIS — I10 Essential (primary) hypertension: Secondary | ICD-10-CM | POA: Diagnosis not present

## 2017-05-28 DIAGNOSIS — I517 Cardiomegaly: Secondary | ICD-10-CM | POA: Diagnosis not present

## 2017-06-07 DIAGNOSIS — Z23 Encounter for immunization: Secondary | ICD-10-CM | POA: Diagnosis not present

## 2017-06-07 DIAGNOSIS — E7800 Pure hypercholesterolemia, unspecified: Secondary | ICD-10-CM | POA: Diagnosis not present

## 2017-06-07 DIAGNOSIS — I480 Paroxysmal atrial fibrillation: Secondary | ICD-10-CM | POA: Diagnosis not present

## 2017-06-07 DIAGNOSIS — M8580 Other specified disorders of bone density and structure, unspecified site: Secondary | ICD-10-CM | POA: Diagnosis not present

## 2017-06-07 DIAGNOSIS — J309 Allergic rhinitis, unspecified: Secondary | ICD-10-CM | POA: Diagnosis not present

## 2017-07-15 DIAGNOSIS — J9801 Acute bronchospasm: Secondary | ICD-10-CM | POA: Diagnosis not present

## 2017-07-15 DIAGNOSIS — J069 Acute upper respiratory infection, unspecified: Secondary | ICD-10-CM | POA: Diagnosis not present

## 2017-07-18 DIAGNOSIS — I483 Typical atrial flutter: Secondary | ICD-10-CM | POA: Diagnosis not present

## 2017-07-18 DIAGNOSIS — I10 Essential (primary) hypertension: Secondary | ICD-10-CM | POA: Diagnosis not present

## 2017-07-22 DIAGNOSIS — R05 Cough: Secondary | ICD-10-CM | POA: Diagnosis not present

## 2017-07-22 DIAGNOSIS — I10 Essential (primary) hypertension: Secondary | ICD-10-CM | POA: Diagnosis not present

## 2017-08-01 DIAGNOSIS — I480 Paroxysmal atrial fibrillation: Secondary | ICD-10-CM | POA: Diagnosis not present

## 2017-08-01 DIAGNOSIS — I483 Typical atrial flutter: Secondary | ICD-10-CM | POA: Diagnosis not present

## 2017-08-01 DIAGNOSIS — I10 Essential (primary) hypertension: Secondary | ICD-10-CM | POA: Diagnosis not present

## 2017-08-15 DIAGNOSIS — I10 Essential (primary) hypertension: Secondary | ICD-10-CM | POA: Diagnosis not present

## 2017-08-15 DIAGNOSIS — R05 Cough: Secondary | ICD-10-CM | POA: Diagnosis not present

## 2017-08-21 DIAGNOSIS — M19071 Primary osteoarthritis, right ankle and foot: Secondary | ICD-10-CM | POA: Diagnosis not present

## 2017-08-21 DIAGNOSIS — M79671 Pain in right foot: Secondary | ICD-10-CM | POA: Diagnosis not present

## 2017-09-06 DIAGNOSIS — M79671 Pain in right foot: Secondary | ICD-10-CM | POA: Diagnosis not present

## 2017-09-27 DIAGNOSIS — I341 Nonrheumatic mitral (valve) prolapse: Secondary | ICD-10-CM | POA: Diagnosis not present

## 2017-09-27 DIAGNOSIS — I480 Paroxysmal atrial fibrillation: Secondary | ICD-10-CM | POA: Diagnosis not present

## 2017-09-27 DIAGNOSIS — Z1211 Encounter for screening for malignant neoplasm of colon: Secondary | ICD-10-CM | POA: Diagnosis not present

## 2017-10-17 DIAGNOSIS — Z961 Presence of intraocular lens: Secondary | ICD-10-CM | POA: Diagnosis not present

## 2017-10-17 DIAGNOSIS — H40013 Open angle with borderline findings, low risk, bilateral: Secondary | ICD-10-CM | POA: Diagnosis not present

## 2017-10-17 DIAGNOSIS — H04123 Dry eye syndrome of bilateral lacrimal glands: Secondary | ICD-10-CM | POA: Diagnosis not present

## 2017-10-17 DIAGNOSIS — H43813 Vitreous degeneration, bilateral: Secondary | ICD-10-CM | POA: Diagnosis not present

## 2017-11-06 DIAGNOSIS — I483 Typical atrial flutter: Secondary | ICD-10-CM | POA: Diagnosis not present

## 2017-11-06 DIAGNOSIS — I480 Paroxysmal atrial fibrillation: Secondary | ICD-10-CM | POA: Diagnosis not present

## 2017-11-06 DIAGNOSIS — I10 Essential (primary) hypertension: Secondary | ICD-10-CM | POA: Diagnosis not present

## 2017-11-06 DIAGNOSIS — Z9889 Other specified postprocedural states: Secondary | ICD-10-CM | POA: Diagnosis not present

## 2017-11-11 ENCOUNTER — Other Ambulatory Visit: Payer: Self-pay | Admitting: Unknown Physician Specialty

## 2017-11-11 DIAGNOSIS — Z1231 Encounter for screening mammogram for malignant neoplasm of breast: Secondary | ICD-10-CM

## 2017-11-27 DIAGNOSIS — K648 Other hemorrhoids: Secondary | ICD-10-CM | POA: Diagnosis not present

## 2017-11-27 DIAGNOSIS — K5730 Diverticulosis of large intestine without perforation or abscess without bleeding: Secondary | ICD-10-CM | POA: Diagnosis not present

## 2017-11-27 DIAGNOSIS — D126 Benign neoplasm of colon, unspecified: Secondary | ICD-10-CM | POA: Diagnosis not present

## 2017-11-27 DIAGNOSIS — Z1211 Encounter for screening for malignant neoplasm of colon: Secondary | ICD-10-CM | POA: Diagnosis not present

## 2017-11-29 DIAGNOSIS — I480 Paroxysmal atrial fibrillation: Secondary | ICD-10-CM | POA: Diagnosis not present

## 2017-11-29 DIAGNOSIS — Z9889 Other specified postprocedural states: Secondary | ICD-10-CM | POA: Diagnosis not present

## 2017-11-29 DIAGNOSIS — I10 Essential (primary) hypertension: Secondary | ICD-10-CM | POA: Diagnosis not present

## 2017-12-03 DIAGNOSIS — Z1211 Encounter for screening for malignant neoplasm of colon: Secondary | ICD-10-CM | POA: Diagnosis not present

## 2017-12-03 DIAGNOSIS — D126 Benign neoplasm of colon, unspecified: Secondary | ICD-10-CM | POA: Diagnosis not present

## 2017-12-10 DIAGNOSIS — R5383 Other fatigue: Secondary | ICD-10-CM | POA: Diagnosis not present

## 2017-12-10 DIAGNOSIS — I10 Essential (primary) hypertension: Secondary | ICD-10-CM | POA: Diagnosis not present

## 2017-12-10 DIAGNOSIS — M899 Disorder of bone, unspecified: Secondary | ICD-10-CM | POA: Diagnosis not present

## 2017-12-10 DIAGNOSIS — E7800 Pure hypercholesterolemia, unspecified: Secondary | ICD-10-CM | POA: Diagnosis not present

## 2017-12-10 DIAGNOSIS — Z0000 Encounter for general adult medical examination without abnormal findings: Secondary | ICD-10-CM | POA: Diagnosis not present

## 2017-12-10 DIAGNOSIS — Z1231 Encounter for screening mammogram for malignant neoplasm of breast: Secondary | ICD-10-CM | POA: Diagnosis not present

## 2017-12-10 DIAGNOSIS — F338 Other recurrent depressive disorders: Secondary | ICD-10-CM | POA: Diagnosis not present

## 2017-12-10 DIAGNOSIS — M8580 Other specified disorders of bone density and structure, unspecified site: Secondary | ICD-10-CM | POA: Diagnosis not present

## 2017-12-23 ENCOUNTER — Ambulatory Visit: Payer: Medicare Other

## 2017-12-23 DIAGNOSIS — I10 Essential (primary) hypertension: Secondary | ICD-10-CM | POA: Diagnosis not present

## 2017-12-23 DIAGNOSIS — R001 Bradycardia, unspecified: Secondary | ICD-10-CM | POA: Diagnosis not present

## 2017-12-23 DIAGNOSIS — I4892 Unspecified atrial flutter: Secondary | ICD-10-CM | POA: Diagnosis not present

## 2017-12-23 DIAGNOSIS — R002 Palpitations: Secondary | ICD-10-CM | POA: Diagnosis not present

## 2017-12-23 DIAGNOSIS — I4891 Unspecified atrial fibrillation: Secondary | ICD-10-CM | POA: Diagnosis not present

## 2017-12-23 DIAGNOSIS — Q211 Atrial septal defect: Secondary | ICD-10-CM | POA: Diagnosis not present

## 2017-12-23 DIAGNOSIS — J45909 Unspecified asthma, uncomplicated: Secondary | ICD-10-CM | POA: Diagnosis not present

## 2017-12-23 DIAGNOSIS — Z7901 Long term (current) use of anticoagulants: Secondary | ICD-10-CM | POA: Diagnosis not present

## 2017-12-23 DIAGNOSIS — Z7952 Long term (current) use of systemic steroids: Secondary | ICD-10-CM | POA: Diagnosis not present

## 2017-12-23 DIAGNOSIS — I471 Supraventricular tachycardia: Secondary | ICD-10-CM | POA: Diagnosis not present

## 2017-12-23 DIAGNOSIS — Z79899 Other long term (current) drug therapy: Secondary | ICD-10-CM | POA: Diagnosis not present

## 2017-12-23 DIAGNOSIS — R000 Tachycardia, unspecified: Secondary | ICD-10-CM | POA: Diagnosis not present

## 2017-12-23 DIAGNOSIS — I4439 Other atrioventricular block: Secondary | ICD-10-CM | POA: Diagnosis not present

## 2017-12-23 DIAGNOSIS — Z7951 Long term (current) use of inhaled steroids: Secondary | ICD-10-CM | POA: Diagnosis not present

## 2017-12-24 ENCOUNTER — Ambulatory Visit
Admission: RE | Admit: 2017-12-24 | Discharge: 2017-12-24 | Disposition: A | Discharge status: Home / Self Care | Payer: Medicare Other | Source: Ambulatory Visit | Attending: Unknown Physician Specialty | Admitting: Unknown Physician Specialty

## 2017-12-24 DIAGNOSIS — Z1231 Encounter for screening mammogram for malignant neoplasm of breast: Secondary | ICD-10-CM | POA: Diagnosis not present

## 2017-12-25 DIAGNOSIS — M79671 Pain in right foot: Secondary | ICD-10-CM | POA: Diagnosis not present

## 2018-03-19 DIAGNOSIS — M79671 Pain in right foot: Secondary | ICD-10-CM | POA: Diagnosis not present

## 2018-05-01 DIAGNOSIS — L0293 Carbuncle, unspecified: Secondary | ICD-10-CM | POA: Diagnosis not present

## 2018-05-21 DIAGNOSIS — M79671 Pain in right foot: Secondary | ICD-10-CM | POA: Diagnosis not present

## 2018-05-27 DIAGNOSIS — F4321 Adjustment disorder with depressed mood: Secondary | ICD-10-CM | POA: Diagnosis not present

## 2018-05-27 DIAGNOSIS — F329 Major depressive disorder, single episode, unspecified: Secondary | ICD-10-CM | POA: Diagnosis not present

## 2018-05-27 DIAGNOSIS — F331 Major depressive disorder, recurrent, moderate: Secondary | ICD-10-CM | POA: Diagnosis not present

## 2018-06-12 DIAGNOSIS — H9190 Unspecified hearing loss, unspecified ear: Secondary | ICD-10-CM | POA: Diagnosis not present

## 2018-06-12 DIAGNOSIS — M8580 Other specified disorders of bone density and structure, unspecified site: Secondary | ICD-10-CM | POA: Diagnosis not present

## 2018-06-12 DIAGNOSIS — E7800 Pure hypercholesterolemia, unspecified: Secondary | ICD-10-CM | POA: Diagnosis not present

## 2018-06-12 DIAGNOSIS — Z23 Encounter for immunization: Secondary | ICD-10-CM | POA: Diagnosis not present

## 2018-06-12 DIAGNOSIS — I480 Paroxysmal atrial fibrillation: Secondary | ICD-10-CM | POA: Diagnosis not present

## 2018-06-12 DIAGNOSIS — I10 Essential (primary) hypertension: Secondary | ICD-10-CM | POA: Diagnosis not present

## 2018-07-01 DIAGNOSIS — F4321 Adjustment disorder with depressed mood: Secondary | ICD-10-CM | POA: Diagnosis not present

## 2018-07-01 DIAGNOSIS — F329 Major depressive disorder, single episode, unspecified: Secondary | ICD-10-CM | POA: Diagnosis not present

## 2018-07-01 DIAGNOSIS — F331 Major depressive disorder, recurrent, moderate: Secondary | ICD-10-CM | POA: Diagnosis not present

## 2018-07-18 DIAGNOSIS — I483 Typical atrial flutter: Secondary | ICD-10-CM | POA: Diagnosis not present

## 2018-07-18 DIAGNOSIS — I480 Paroxysmal atrial fibrillation: Secondary | ICD-10-CM | POA: Diagnosis not present

## 2018-07-23 DIAGNOSIS — I480 Paroxysmal atrial fibrillation: Secondary | ICD-10-CM | POA: Diagnosis not present

## 2018-07-24 ENCOUNTER — Other Ambulatory Visit: Payer: Self-pay

## 2018-08-05 DIAGNOSIS — F4321 Adjustment disorder with depressed mood: Secondary | ICD-10-CM | POA: Diagnosis not present

## 2018-08-11 DIAGNOSIS — M79671 Pain in right foot: Secondary | ICD-10-CM | POA: Diagnosis not present

## 2018-09-03 IMAGING — MG 2D DIGITAL SCREENING BILATERAL MAMMOGRAM WITH CAD AND ADJUNCT TO
9 of 15 series · 9 of 35 positions shown · non-contrast
Comparison: Previous exam(s).

CLINICAL DATA: Screening.

EXAM:
2D DIGITAL SCREENING BILATERAL MAMMOGRAM WITH CAD AND ADJUNCT TOMO

[L CC]
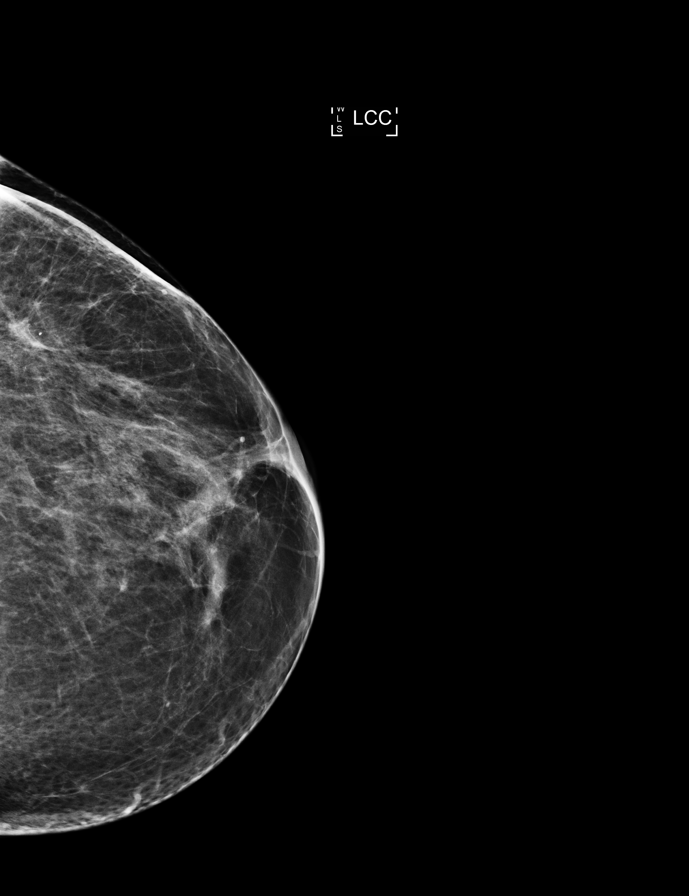

[L MLO (1 of 2)]
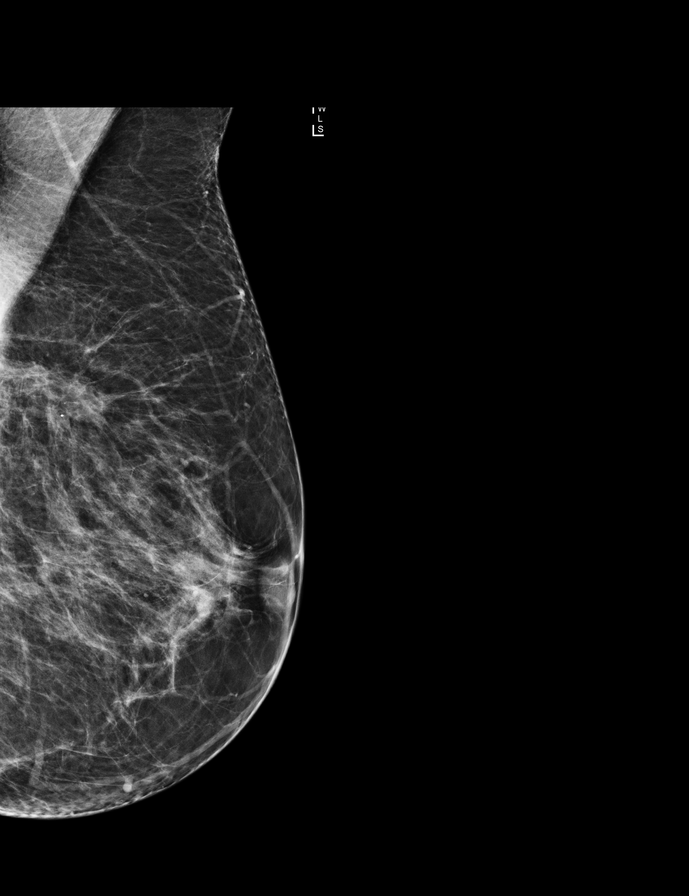

[L MLO synth-2D]
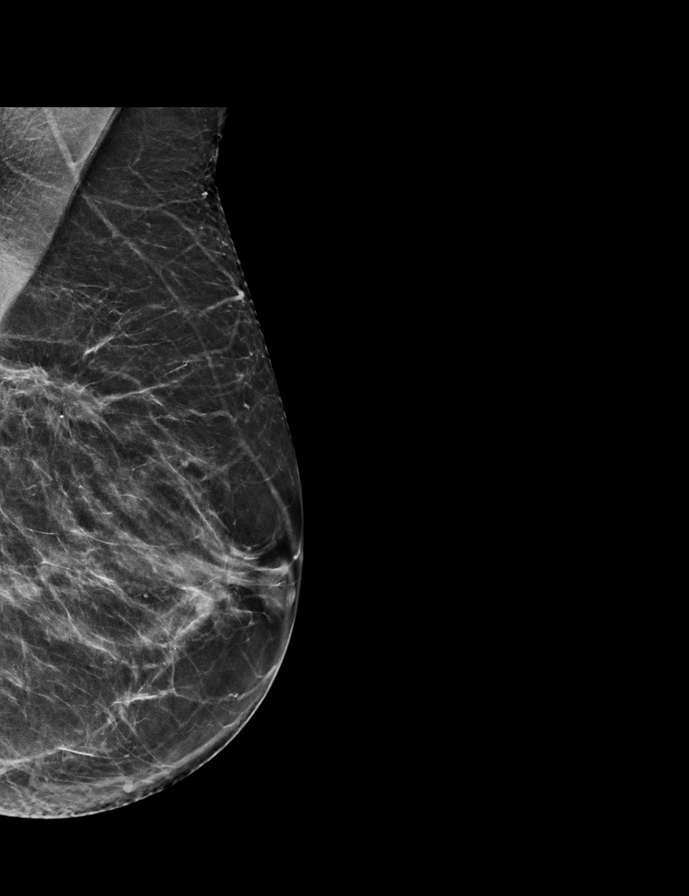

[L MLO (2 of 2)]
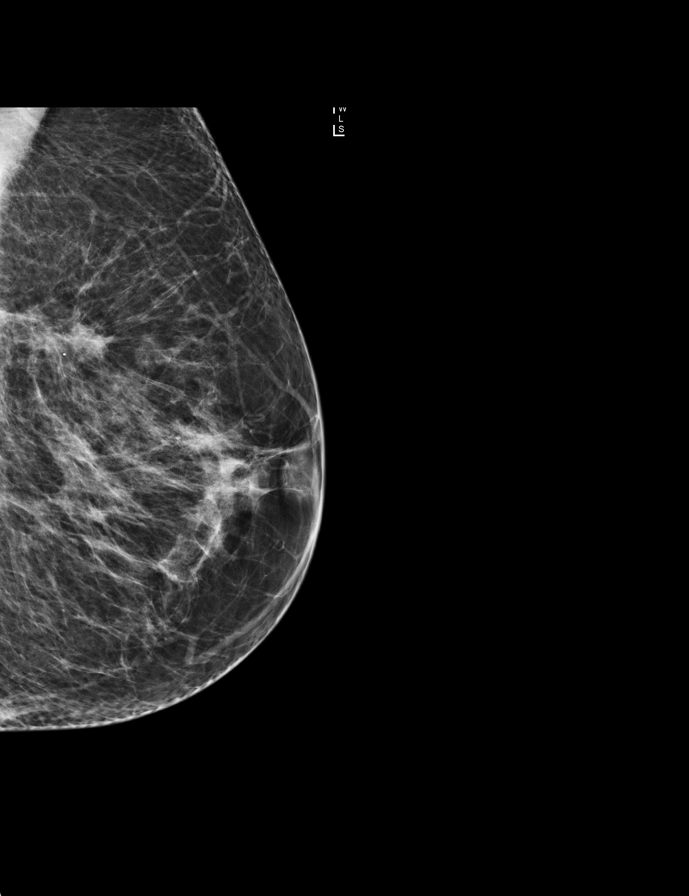

[R MLO]
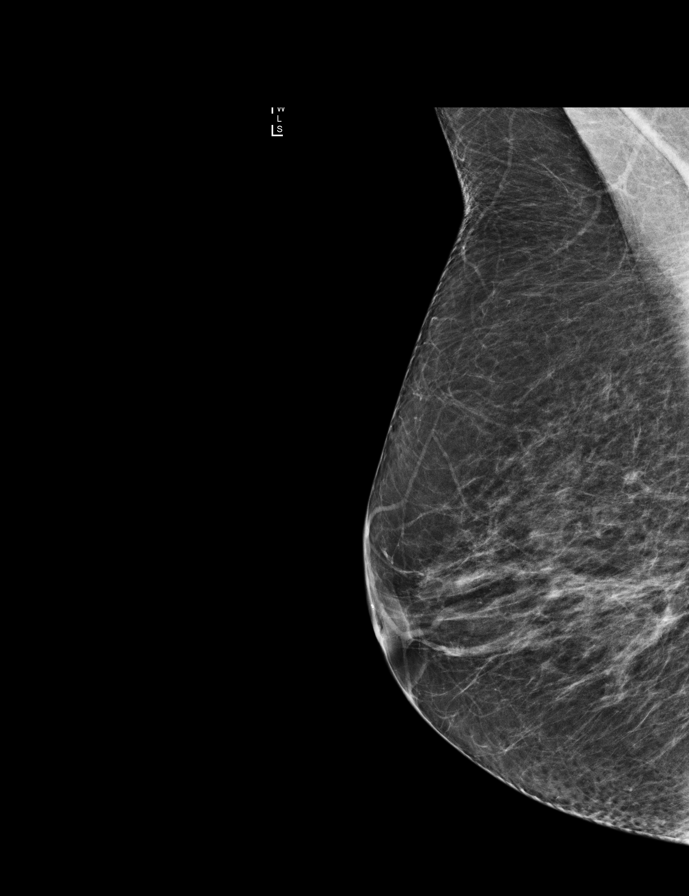

[L CC synth-2D]
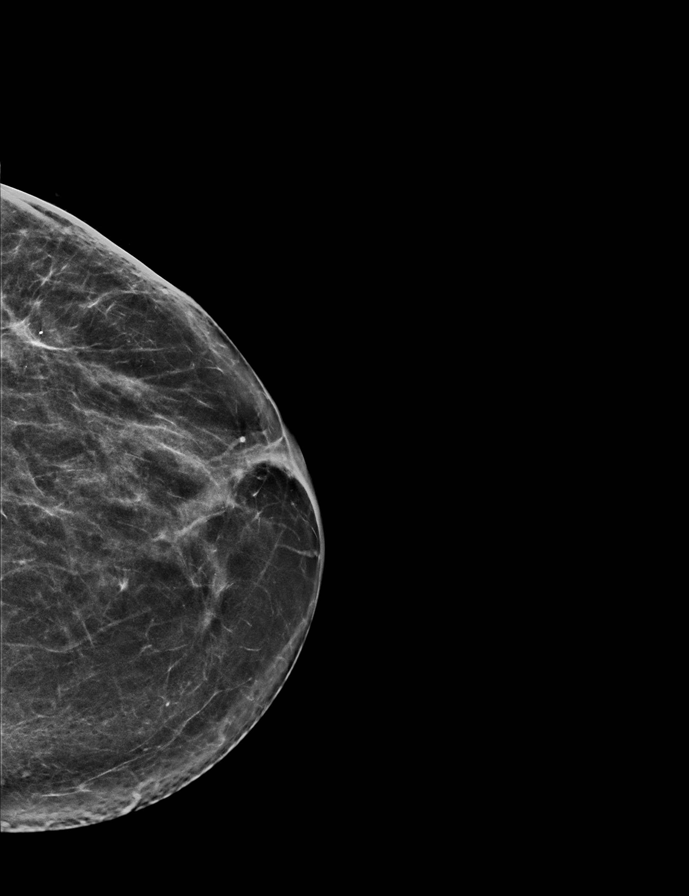

[R CC]
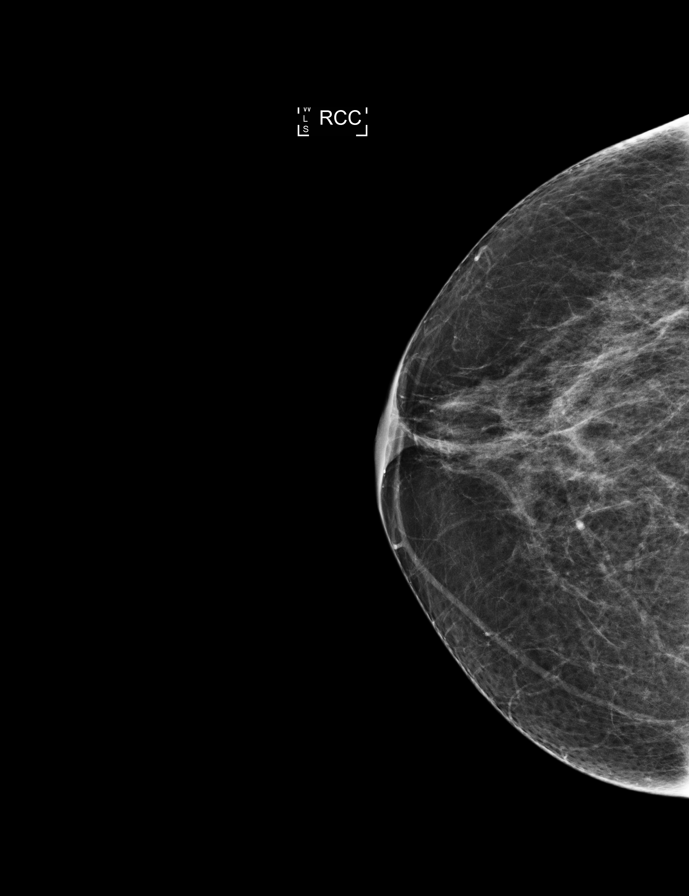

[R CC synth-2D]
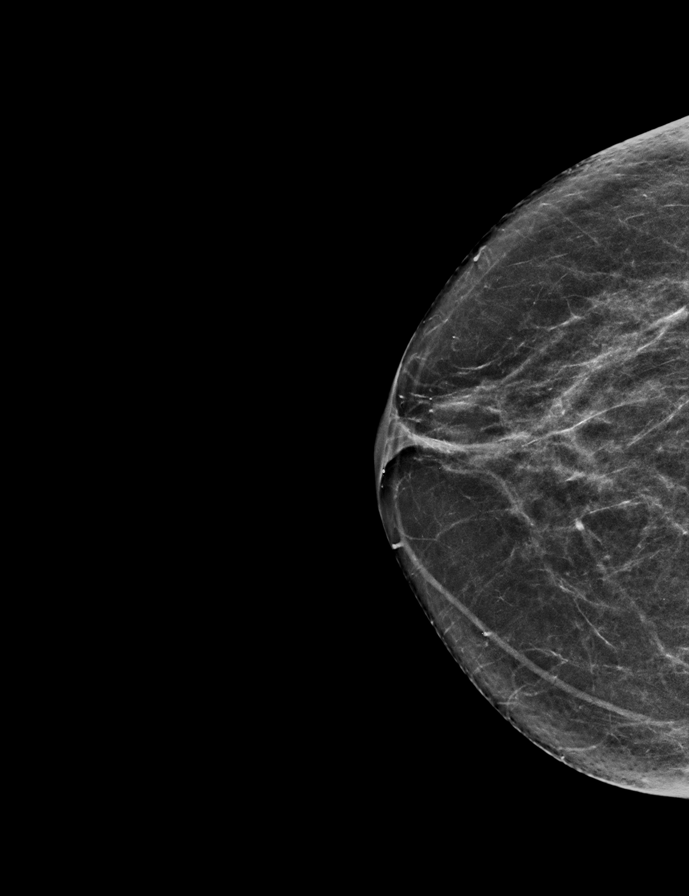

[R MLO synth-2D]
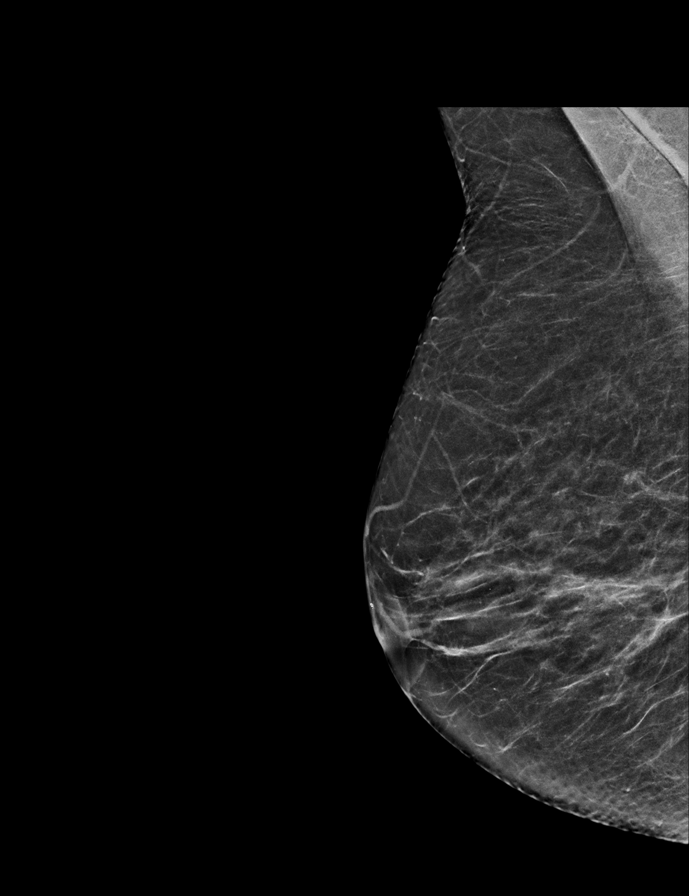

[9 of 35 positions shown; findings below may reference images not displayed]

ACR Breast Density Category b: There are scattered areas of
fibroglandular density.
FINDINGS: There are no findings suspicious for malignancy. Images were
processed with CAD.
IMPRESSION: No mammographic evidence of malignancy. A result letter of this
screening mammogram will be mailed directly to the patient.

RECOMMENDATION:
Screening mammogram in one year. (Code:97-6-RS4)

BI-RADS CATEGORY  1: Negative.

## 2018-09-24 DIAGNOSIS — M79671 Pain in right foot: Secondary | ICD-10-CM | POA: Diagnosis not present

## 2018-09-30 DIAGNOSIS — F329 Major depressive disorder, single episode, unspecified: Secondary | ICD-10-CM | POA: Diagnosis not present

## 2018-09-30 DIAGNOSIS — F4321 Adjustment disorder with depressed mood: Secondary | ICD-10-CM | POA: Diagnosis not present

## 2018-10-23 DIAGNOSIS — H04123 Dry eye syndrome of bilateral lacrimal glands: Secondary | ICD-10-CM | POA: Diagnosis not present

## 2018-10-23 DIAGNOSIS — H43813 Vitreous degeneration, bilateral: Secondary | ICD-10-CM | POA: Diagnosis not present

## 2018-10-23 DIAGNOSIS — H40013 Open angle with borderline findings, low risk, bilateral: Secondary | ICD-10-CM | POA: Diagnosis not present

## 2018-10-23 DIAGNOSIS — Z961 Presence of intraocular lens: Secondary | ICD-10-CM | POA: Diagnosis not present

## 2018-11-10 DIAGNOSIS — M79671 Pain in right foot: Secondary | ICD-10-CM | POA: Diagnosis not present

## 2018-11-17 ENCOUNTER — Other Ambulatory Visit: Payer: Self-pay | Admitting: Unknown Physician Specialty

## 2018-11-17 DIAGNOSIS — Z1231 Encounter for screening mammogram for malignant neoplasm of breast: Secondary | ICD-10-CM

## 2018-12-08 DIAGNOSIS — F4321 Adjustment disorder with depressed mood: Secondary | ICD-10-CM | POA: Diagnosis not present

## 2018-12-08 DIAGNOSIS — F329 Major depressive disorder, single episode, unspecified: Secondary | ICD-10-CM | POA: Diagnosis not present

## 2018-12-29 ENCOUNTER — Ambulatory Visit: Payer: Medicare Other

## 2019-01-06 DIAGNOSIS — F331 Major depressive disorder, recurrent, moderate: Secondary | ICD-10-CM | POA: Diagnosis not present

## 2019-01-16 DIAGNOSIS — Z9889 Other specified postprocedural states: Secondary | ICD-10-CM | POA: Diagnosis not present

## 2019-01-16 DIAGNOSIS — Q211 Atrial septal defect: Secondary | ICD-10-CM | POA: Diagnosis not present

## 2019-01-16 DIAGNOSIS — I483 Typical atrial flutter: Secondary | ICD-10-CM | POA: Diagnosis not present

## 2019-01-16 DIAGNOSIS — I480 Paroxysmal atrial fibrillation: Secondary | ICD-10-CM | POA: Diagnosis not present

## 2019-01-27 DIAGNOSIS — M79671 Pain in right foot: Secondary | ICD-10-CM | POA: Diagnosis not present

## 2019-01-27 DIAGNOSIS — F329 Major depressive disorder, single episode, unspecified: Secondary | ICD-10-CM | POA: Diagnosis not present

## 2019-01-27 DIAGNOSIS — F4321 Adjustment disorder with depressed mood: Secondary | ICD-10-CM | POA: Diagnosis not present

## 2019-02-11 ENCOUNTER — Ambulatory Visit
Admission: RE | Admit: 2019-02-11 | Discharge: 2019-02-11 | Disposition: A | Discharge status: Home / Self Care | Payer: Medicare Other | Source: Ambulatory Visit | Attending: Unknown Physician Specialty | Admitting: Unknown Physician Specialty

## 2019-02-11 ENCOUNTER — Other Ambulatory Visit: Payer: Self-pay

## 2019-02-11 DIAGNOSIS — Z1231 Encounter for screening mammogram for malignant neoplasm of breast: Secondary | ICD-10-CM | POA: Diagnosis not present

## 2019-02-12 DIAGNOSIS — I10 Essential (primary) hypertension: Secondary | ICD-10-CM | POA: Diagnosis not present

## 2019-02-12 DIAGNOSIS — I483 Typical atrial flutter: Secondary | ICD-10-CM | POA: Diagnosis not present

## 2019-02-12 DIAGNOSIS — Z9889 Other specified postprocedural states: Secondary | ICD-10-CM | POA: Diagnosis not present

## 2019-02-12 DIAGNOSIS — I480 Paroxysmal atrial fibrillation: Secondary | ICD-10-CM | POA: Diagnosis not present

## 2019-02-19 DIAGNOSIS — M8580 Other specified disorders of bone density and structure, unspecified site: Secondary | ICD-10-CM | POA: Diagnosis not present

## 2019-02-19 DIAGNOSIS — B001 Herpesviral vesicular dermatitis: Secondary | ICD-10-CM | POA: Diagnosis not present

## 2019-02-19 DIAGNOSIS — G479 Sleep disorder, unspecified: Secondary | ICD-10-CM | POA: Diagnosis not present

## 2019-02-19 DIAGNOSIS — I10 Essential (primary) hypertension: Secondary | ICD-10-CM | POA: Diagnosis not present

## 2019-02-19 DIAGNOSIS — E7800 Pure hypercholesterolemia, unspecified: Secondary | ICD-10-CM | POA: Diagnosis not present

## 2019-02-25 DIAGNOSIS — F4321 Adjustment disorder with depressed mood: Secondary | ICD-10-CM | POA: Diagnosis not present

## 2019-02-25 DIAGNOSIS — M79671 Pain in right foot: Secondary | ICD-10-CM | POA: Diagnosis not present

## 2019-02-27 DIAGNOSIS — J029 Acute pharyngitis, unspecified: Secondary | ICD-10-CM | POA: Diagnosis not present

## 2019-02-27 DIAGNOSIS — B9789 Other viral agents as the cause of diseases classified elsewhere: Secondary | ICD-10-CM | POA: Diagnosis not present

## 2019-02-27 DIAGNOSIS — J988 Other specified respiratory disorders: Secondary | ICD-10-CM | POA: Diagnosis not present

## 2019-02-27 DIAGNOSIS — M79671 Pain in right foot: Secondary | ICD-10-CM | POA: Diagnosis not present

## 2019-02-27 DIAGNOSIS — Z20828 Contact with and (suspected) exposure to other viral communicable diseases: Secondary | ICD-10-CM | POA: Diagnosis not present

## 2019-03-02 DIAGNOSIS — M19071 Primary osteoarthritis, right ankle and foot: Secondary | ICD-10-CM | POA: Diagnosis not present

## 2019-04-22 DIAGNOSIS — F4321 Adjustment disorder with depressed mood: Secondary | ICD-10-CM | POA: Diagnosis not present

## 2019-04-22 DIAGNOSIS — F329 Major depressive disorder, single episode, unspecified: Secondary | ICD-10-CM | POA: Diagnosis not present

## 2019-04-29 DIAGNOSIS — M79671 Pain in right foot: Secondary | ICD-10-CM | POA: Diagnosis not present

## 2019-05-28 DIAGNOSIS — Z23 Encounter for immunization: Secondary | ICD-10-CM | POA: Diagnosis not present

## 2019-06-08 DIAGNOSIS — F4321 Adjustment disorder with depressed mood: Secondary | ICD-10-CM | POA: Diagnosis not present

## 2019-06-09 DIAGNOSIS — R5383 Other fatigue: Secondary | ICD-10-CM | POA: Diagnosis not present

## 2019-06-09 DIAGNOSIS — I10 Essential (primary) hypertension: Secondary | ICD-10-CM | POA: Diagnosis not present

## 2019-06-09 DIAGNOSIS — G479 Sleep disorder, unspecified: Secondary | ICD-10-CM | POA: Diagnosis not present

## 2019-06-09 DIAGNOSIS — Z0000 Encounter for general adult medical examination without abnormal findings: Secondary | ICD-10-CM | POA: Diagnosis not present

## 2019-06-09 DIAGNOSIS — E7800 Pure hypercholesterolemia, unspecified: Secondary | ICD-10-CM | POA: Diagnosis not present

## 2019-06-09 DIAGNOSIS — Z01419 Encounter for gynecological examination (general) (routine) without abnormal findings: Secondary | ICD-10-CM | POA: Diagnosis not present

## 2019-06-09 DIAGNOSIS — M8580 Other specified disorders of bone density and structure, unspecified site: Secondary | ICD-10-CM | POA: Diagnosis not present

## 2019-06-09 DIAGNOSIS — J309 Allergic rhinitis, unspecified: Secondary | ICD-10-CM | POA: Diagnosis not present

## 2019-06-09 DIAGNOSIS — M899 Disorder of bone, unspecified: Secondary | ICD-10-CM | POA: Diagnosis not present

## 2019-07-01 DIAGNOSIS — Z13820 Encounter for screening for osteoporosis: Secondary | ICD-10-CM | POA: Diagnosis not present

## 2019-07-01 DIAGNOSIS — M85851 Other specified disorders of bone density and structure, right thigh: Secondary | ICD-10-CM | POA: Diagnosis not present

## 2019-07-01 DIAGNOSIS — Z780 Asymptomatic menopausal state: Secondary | ICD-10-CM | POA: Diagnosis not present

## 2019-07-27 DIAGNOSIS — F4321 Adjustment disorder with depressed mood: Secondary | ICD-10-CM | POA: Diagnosis not present

## 2019-07-27 DIAGNOSIS — M79671 Pain in right foot: Secondary | ICD-10-CM | POA: Diagnosis not present

## 2019-09-25 DIAGNOSIS — Z9889 Other specified postprocedural states: Secondary | ICD-10-CM | POA: Diagnosis not present

## 2019-09-25 DIAGNOSIS — I480 Paroxysmal atrial fibrillation: Secondary | ICD-10-CM | POA: Diagnosis not present

## 2019-09-25 DIAGNOSIS — Z961 Presence of intraocular lens: Secondary | ICD-10-CM | POA: Diagnosis not present

## 2019-09-25 DIAGNOSIS — I493 Ventricular premature depolarization: Secondary | ICD-10-CM | POA: Diagnosis not present

## 2019-09-25 DIAGNOSIS — Z79899 Other long term (current) drug therapy: Secondary | ICD-10-CM | POA: Diagnosis not present

## 2019-09-25 DIAGNOSIS — Q211 Atrial septal defect: Secondary | ICD-10-CM | POA: Diagnosis not present

## 2019-09-25 DIAGNOSIS — I483 Typical atrial flutter: Secondary | ICD-10-CM | POA: Diagnosis not present

## 2019-09-25 DIAGNOSIS — Z853 Personal history of malignant neoplasm of breast: Secondary | ICD-10-CM | POA: Diagnosis not present

## 2019-09-25 DIAGNOSIS — K219 Gastro-esophageal reflux disease without esophagitis: Secondary | ICD-10-CM | POA: Diagnosis not present

## 2019-09-25 DIAGNOSIS — M1990 Unspecified osteoarthritis, unspecified site: Secondary | ICD-10-CM | POA: Diagnosis not present

## 2019-09-25 DIAGNOSIS — I471 Supraventricular tachycardia: Secondary | ICD-10-CM | POA: Diagnosis not present

## 2019-09-25 DIAGNOSIS — I10 Essential (primary) hypertension: Secondary | ICD-10-CM | POA: Diagnosis not present

## 2019-09-28 DIAGNOSIS — H903 Sensorineural hearing loss, bilateral: Secondary | ICD-10-CM | POA: Diagnosis not present

## 2019-09-28 DIAGNOSIS — H6982 Other specified disorders of Eustachian tube, left ear: Secondary | ICD-10-CM | POA: Diagnosis not present

## 2019-09-28 DIAGNOSIS — H9312 Tinnitus, left ear: Secondary | ICD-10-CM | POA: Diagnosis not present

## 2019-09-28 DIAGNOSIS — H838X3 Other specified diseases of inner ear, bilateral: Secondary | ICD-10-CM | POA: Diagnosis not present

## 2019-10-09 DIAGNOSIS — M79671 Pain in right foot: Secondary | ICD-10-CM | POA: Diagnosis not present

## 2019-10-15 ENCOUNTER — Ambulatory Visit: Payer: Medicare Other

## 2019-10-26 DIAGNOSIS — Z961 Presence of intraocular lens: Secondary | ICD-10-CM | POA: Diagnosis not present

## 2019-10-26 DIAGNOSIS — H04123 Dry eye syndrome of bilateral lacrimal glands: Secondary | ICD-10-CM | POA: Diagnosis not present

## 2019-10-26 DIAGNOSIS — H40013 Open angle with borderline findings, low risk, bilateral: Secondary | ICD-10-CM | POA: Diagnosis not present

## 2019-10-26 DIAGNOSIS — H43813 Vitreous degeneration, bilateral: Secondary | ICD-10-CM | POA: Diagnosis not present

## 2019-10-28 DIAGNOSIS — I480 Paroxysmal atrial fibrillation: Secondary | ICD-10-CM | POA: Diagnosis not present

## 2019-10-28 DIAGNOSIS — I493 Ventricular premature depolarization: Secondary | ICD-10-CM | POA: Diagnosis not present

## 2019-10-28 DIAGNOSIS — I472 Ventricular tachycardia: Secondary | ICD-10-CM | POA: Diagnosis not present

## 2019-11-04 DIAGNOSIS — M79671 Pain in right foot: Secondary | ICD-10-CM | POA: Diagnosis not present

## 2019-11-10 DIAGNOSIS — I483 Typical atrial flutter: Secondary | ICD-10-CM | POA: Diagnosis not present

## 2019-11-10 DIAGNOSIS — I10 Essential (primary) hypertension: Secondary | ICD-10-CM | POA: Diagnosis not present

## 2019-11-10 DIAGNOSIS — I480 Paroxysmal atrial fibrillation: Secondary | ICD-10-CM | POA: Diagnosis not present

## 2019-12-03 DIAGNOSIS — F4321 Adjustment disorder with depressed mood: Secondary | ICD-10-CM | POA: Diagnosis not present

## 2019-12-03 DIAGNOSIS — F329 Major depressive disorder, single episode, unspecified: Secondary | ICD-10-CM | POA: Diagnosis not present

## 2019-12-09 DIAGNOSIS — G479 Sleep disorder, unspecified: Secondary | ICD-10-CM | POA: Diagnosis not present

## 2019-12-09 DIAGNOSIS — E7800 Pure hypercholesterolemia, unspecified: Secondary | ICD-10-CM | POA: Diagnosis not present

## 2019-12-09 DIAGNOSIS — I10 Essential (primary) hypertension: Secondary | ICD-10-CM | POA: Diagnosis not present

## 2019-12-09 DIAGNOSIS — B001 Herpesviral vesicular dermatitis: Secondary | ICD-10-CM | POA: Diagnosis not present

## 2019-12-26 DIAGNOSIS — Z9889 Other specified postprocedural states: Secondary | ICD-10-CM | POA: Diagnosis not present

## 2019-12-26 DIAGNOSIS — N281 Cyst of kidney, acquired: Secondary | ICD-10-CM | POA: Diagnosis not present

## 2019-12-26 DIAGNOSIS — K7689 Other specified diseases of liver: Secondary | ICD-10-CM | POA: Diagnosis not present

## 2019-12-26 DIAGNOSIS — Z79899 Other long term (current) drug therapy: Secondary | ICD-10-CM | POA: Diagnosis not present

## 2019-12-26 DIAGNOSIS — Z8774 Personal history of (corrected) congenital malformations of heart and circulatory system: Secondary | ICD-10-CM | POA: Diagnosis not present

## 2019-12-26 DIAGNOSIS — I471 Supraventricular tachycardia: Secondary | ICD-10-CM | POA: Diagnosis not present

## 2019-12-26 DIAGNOSIS — J45909 Unspecified asthma, uncomplicated: Secondary | ICD-10-CM | POA: Diagnosis not present

## 2019-12-26 DIAGNOSIS — I10 Essential (primary) hypertension: Secondary | ICD-10-CM | POA: Diagnosis not present

## 2019-12-26 DIAGNOSIS — K3580 Unspecified acute appendicitis: Secondary | ICD-10-CM | POA: Diagnosis not present

## 2019-12-26 DIAGNOSIS — Z7951 Long term (current) use of inhaled steroids: Secondary | ICD-10-CM | POA: Diagnosis not present

## 2019-12-26 DIAGNOSIS — K219 Gastro-esophageal reflux disease without esophagitis: Secondary | ICD-10-CM | POA: Diagnosis not present

## 2019-12-26 DIAGNOSIS — Z853 Personal history of malignant neoplasm of breast: Secondary | ICD-10-CM | POA: Diagnosis not present

## 2019-12-26 DIAGNOSIS — F331 Major depressive disorder, recurrent, moderate: Secondary | ICD-10-CM | POA: Diagnosis not present

## 2019-12-26 DIAGNOSIS — K3589 Other acute appendicitis without perforation or gangrene: Secondary | ICD-10-CM | POA: Diagnosis not present

## 2019-12-26 DIAGNOSIS — I483 Typical atrial flutter: Secondary | ICD-10-CM | POA: Diagnosis not present

## 2019-12-26 DIAGNOSIS — R918 Other nonspecific abnormal finding of lung field: Secondary | ICD-10-CM | POA: Diagnosis not present

## 2019-12-26 DIAGNOSIS — R911 Solitary pulmonary nodule: Secondary | ICD-10-CM | POA: Diagnosis not present

## 2019-12-26 DIAGNOSIS — I480 Paroxysmal atrial fibrillation: Secondary | ICD-10-CM | POA: Diagnosis not present

## 2019-12-26 DIAGNOSIS — Z7901 Long term (current) use of anticoagulants: Secondary | ICD-10-CM | POA: Diagnosis not present

## 2019-12-27 DIAGNOSIS — Z9889 Other specified postprocedural states: Secondary | ICD-10-CM | POA: Diagnosis not present

## 2019-12-27 DIAGNOSIS — K3580 Unspecified acute appendicitis: Secondary | ICD-10-CM | POA: Diagnosis not present

## 2019-12-27 DIAGNOSIS — K219 Gastro-esophageal reflux disease without esophagitis: Secondary | ICD-10-CM | POA: Diagnosis not present

## 2019-12-27 DIAGNOSIS — Q211 Atrial septal defect: Secondary | ICD-10-CM | POA: Diagnosis not present

## 2019-12-27 DIAGNOSIS — I480 Paroxysmal atrial fibrillation: Secondary | ICD-10-CM | POA: Diagnosis not present

## 2019-12-27 DIAGNOSIS — F4321 Adjustment disorder with depressed mood: Secondary | ICD-10-CM | POA: Diagnosis not present

## 2019-12-27 DIAGNOSIS — E876 Hypokalemia: Secondary | ICD-10-CM | POA: Diagnosis not present

## 2019-12-27 DIAGNOSIS — F331 Major depressive disorder, recurrent, moderate: Secondary | ICD-10-CM | POA: Diagnosis not present

## 2019-12-27 DIAGNOSIS — I483 Typical atrial flutter: Secondary | ICD-10-CM | POA: Diagnosis not present

## 2019-12-27 DIAGNOSIS — I10 Essential (primary) hypertension: Secondary | ICD-10-CM | POA: Diagnosis not present

## 2020-01-05 DIAGNOSIS — R5383 Other fatigue: Secondary | ICD-10-CM | POA: Diagnosis not present

## 2020-01-05 DIAGNOSIS — I10 Essential (primary) hypertension: Secondary | ICD-10-CM | POA: Diagnosis not present

## 2020-01-05 DIAGNOSIS — F4321 Adjustment disorder with depressed mood: Secondary | ICD-10-CM | POA: Diagnosis not present

## 2020-01-11 DIAGNOSIS — Z9889 Other specified postprocedural states: Secondary | ICD-10-CM | POA: Diagnosis not present

## 2020-01-14 DIAGNOSIS — I480 Paroxysmal atrial fibrillation: Secondary | ICD-10-CM | POA: Diagnosis not present

## 2020-01-14 DIAGNOSIS — I10 Essential (primary) hypertension: Secondary | ICD-10-CM | POA: Diagnosis not present

## 2020-01-14 DIAGNOSIS — I483 Typical atrial flutter: Secondary | ICD-10-CM | POA: Diagnosis not present

## 2020-02-17 DIAGNOSIS — I10 Essential (primary) hypertension: Secondary | ICD-10-CM | POA: Diagnosis not present

## 2020-02-17 DIAGNOSIS — I517 Cardiomegaly: Secondary | ICD-10-CM | POA: Diagnosis not present

## 2020-02-17 DIAGNOSIS — I483 Typical atrial flutter: Secondary | ICD-10-CM | POA: Diagnosis not present

## 2020-02-17 DIAGNOSIS — I081 Rheumatic disorders of both mitral and tricuspid valves: Secondary | ICD-10-CM | POA: Diagnosis not present

## 2020-02-17 DIAGNOSIS — Z9889 Other specified postprocedural states: Secondary | ICD-10-CM | POA: Diagnosis not present

## 2020-02-17 DIAGNOSIS — I480 Paroxysmal atrial fibrillation: Secondary | ICD-10-CM | POA: Diagnosis not present

## 2020-02-24 DIAGNOSIS — M79671 Pain in right foot: Secondary | ICD-10-CM | POA: Diagnosis not present

## 2020-03-08 DIAGNOSIS — Z1231 Encounter for screening mammogram for malignant neoplasm of breast: Secondary | ICD-10-CM | POA: Diagnosis not present

## 2020-04-26 DIAGNOSIS — F4321 Adjustment disorder with depressed mood: Secondary | ICD-10-CM | POA: Diagnosis not present

## 2020-04-26 DIAGNOSIS — F331 Major depressive disorder, recurrent, moderate: Secondary | ICD-10-CM | POA: Diagnosis not present

## 2020-06-17 DIAGNOSIS — Z0000 Encounter for general adult medical examination without abnormal findings: Secondary | ICD-10-CM | POA: Diagnosis not present

## 2020-06-17 DIAGNOSIS — B001 Herpesviral vesicular dermatitis: Secondary | ICD-10-CM | POA: Diagnosis not present

## 2020-06-17 DIAGNOSIS — I10 Essential (primary) hypertension: Secondary | ICD-10-CM | POA: Diagnosis not present

## 2020-06-17 DIAGNOSIS — Z23 Encounter for immunization: Secondary | ICD-10-CM | POA: Diagnosis not present

## 2020-06-17 DIAGNOSIS — E7800 Pure hypercholesterolemia, unspecified: Secondary | ICD-10-CM | POA: Diagnosis not present

## 2020-06-17 DIAGNOSIS — I480 Paroxysmal atrial fibrillation: Secondary | ICD-10-CM | POA: Diagnosis not present

## 2020-06-17 DIAGNOSIS — M899 Disorder of bone, unspecified: Secondary | ICD-10-CM | POA: Diagnosis not present

## 2020-06-17 DIAGNOSIS — R5383 Other fatigue: Secondary | ICD-10-CM | POA: Diagnosis not present

## 2020-06-17 DIAGNOSIS — M8580 Other specified disorders of bone density and structure, unspecified site: Secondary | ICD-10-CM | POA: Diagnosis not present

## 2020-06-17 DIAGNOSIS — Z01419 Encounter for gynecological examination (general) (routine) without abnormal findings: Secondary | ICD-10-CM | POA: Diagnosis not present

## 2020-06-17 DIAGNOSIS — G479 Sleep disorder, unspecified: Secondary | ICD-10-CM | POA: Diagnosis not present

## 2020-07-08 DIAGNOSIS — M79671 Pain in right foot: Secondary | ICD-10-CM | POA: Diagnosis not present

## 2020-07-22 DIAGNOSIS — Z23 Encounter for immunization: Secondary | ICD-10-CM | POA: Diagnosis not present

## 2020-10-18 DIAGNOSIS — M79671 Pain in right foot: Secondary | ICD-10-CM | POA: Diagnosis not present

## 2020-10-27 DIAGNOSIS — Z961 Presence of intraocular lens: Secondary | ICD-10-CM | POA: Diagnosis not present

## 2020-10-27 DIAGNOSIS — G43B0 Ophthalmoplegic migraine, not intractable: Secondary | ICD-10-CM | POA: Diagnosis not present

## 2020-10-27 DIAGNOSIS — H40013 Open angle with borderline findings, low risk, bilateral: Secondary | ICD-10-CM | POA: Diagnosis not present

## 2020-10-27 DIAGNOSIS — H04123 Dry eye syndrome of bilateral lacrimal glands: Secondary | ICD-10-CM | POA: Diagnosis not present

## 2020-12-07 DIAGNOSIS — B001 Herpesviral vesicular dermatitis: Secondary | ICD-10-CM | POA: Diagnosis not present

## 2020-12-07 DIAGNOSIS — E7800 Pure hypercholesterolemia, unspecified: Secondary | ICD-10-CM | POA: Diagnosis not present

## 2020-12-07 DIAGNOSIS — L309 Dermatitis, unspecified: Secondary | ICD-10-CM | POA: Diagnosis not present

## 2020-12-07 DIAGNOSIS — G479 Sleep disorder, unspecified: Secondary | ICD-10-CM | POA: Diagnosis not present

## 2020-12-08 DIAGNOSIS — E7800 Pure hypercholesterolemia, unspecified: Secondary | ICD-10-CM | POA: Diagnosis not present

## 2020-12-08 DIAGNOSIS — I10 Essential (primary) hypertension: Secondary | ICD-10-CM | POA: Diagnosis not present

## 2021-01-10 DIAGNOSIS — Z23 Encounter for immunization: Secondary | ICD-10-CM | POA: Diagnosis not present

## 2021-01-27 DIAGNOSIS — R5383 Other fatigue: Secondary | ICD-10-CM | POA: Diagnosis not present

## 2021-01-27 DIAGNOSIS — F329 Major depressive disorder, single episode, unspecified: Secondary | ICD-10-CM | POA: Diagnosis not present

## 2021-01-27 DIAGNOSIS — F4321 Adjustment disorder with depressed mood: Secondary | ICD-10-CM | POA: Diagnosis not present

## 2021-01-27 DIAGNOSIS — F419 Anxiety disorder, unspecified: Secondary | ICD-10-CM | POA: Diagnosis not present

## 2021-01-27 DIAGNOSIS — F331 Major depressive disorder, recurrent, moderate: Secondary | ICD-10-CM | POA: Diagnosis not present

## 2021-02-01 DIAGNOSIS — I480 Paroxysmal atrial fibrillation: Secondary | ICD-10-CM | POA: Diagnosis not present

## 2021-02-01 DIAGNOSIS — I10 Essential (primary) hypertension: Secondary | ICD-10-CM | POA: Diagnosis not present

## 2021-02-01 DIAGNOSIS — Z9889 Other specified postprocedural states: Secondary | ICD-10-CM | POA: Diagnosis not present

## 2021-02-01 DIAGNOSIS — I483 Typical atrial flutter: Secondary | ICD-10-CM | POA: Diagnosis not present

## 2021-02-01 DIAGNOSIS — I493 Ventricular premature depolarization: Secondary | ICD-10-CM | POA: Diagnosis not present

## 2021-02-07 DIAGNOSIS — M79671 Pain in right foot: Secondary | ICD-10-CM | POA: Diagnosis not present

## 2021-03-10 DIAGNOSIS — Z1231 Encounter for screening mammogram for malignant neoplasm of breast: Secondary | ICD-10-CM | POA: Diagnosis not present

## 2021-05-10 DIAGNOSIS — M79671 Pain in right foot: Secondary | ICD-10-CM | POA: Diagnosis not present

## 2021-05-30 DIAGNOSIS — I483 Typical atrial flutter: Secondary | ICD-10-CM | POA: Diagnosis not present

## 2021-05-30 DIAGNOSIS — Z9889 Other specified postprocedural states: Secondary | ICD-10-CM | POA: Diagnosis not present

## 2021-05-30 DIAGNOSIS — I10 Essential (primary) hypertension: Secondary | ICD-10-CM | POA: Diagnosis not present

## 2021-05-30 DIAGNOSIS — I480 Paroxysmal atrial fibrillation: Secondary | ICD-10-CM | POA: Diagnosis not present

## 2021-05-31 DIAGNOSIS — M79671 Pain in right foot: Secondary | ICD-10-CM | POA: Diagnosis not present

## 2021-06-08 DIAGNOSIS — Z23 Encounter for immunization: Secondary | ICD-10-CM | POA: Diagnosis not present

## 2021-06-16 DIAGNOSIS — B001 Herpesviral vesicular dermatitis: Secondary | ICD-10-CM | POA: Diagnosis not present

## 2021-06-16 DIAGNOSIS — G479 Sleep disorder, unspecified: Secondary | ICD-10-CM | POA: Diagnosis not present

## 2021-06-16 DIAGNOSIS — I10 Essential (primary) hypertension: Secondary | ICD-10-CM | POA: Diagnosis not present

## 2021-06-16 DIAGNOSIS — Z0000 Encounter for general adult medical examination without abnormal findings: Secondary | ICD-10-CM | POA: Diagnosis not present

## 2021-06-16 DIAGNOSIS — Z01419 Encounter for gynecological examination (general) (routine) without abnormal findings: Secondary | ICD-10-CM | POA: Diagnosis not present

## 2021-06-16 DIAGNOSIS — E7800 Pure hypercholesterolemia, unspecified: Secondary | ICD-10-CM | POA: Diagnosis not present

## 2021-06-19 DIAGNOSIS — E7800 Pure hypercholesterolemia, unspecified: Secondary | ICD-10-CM | POA: Diagnosis not present

## 2021-06-19 DIAGNOSIS — I480 Paroxysmal atrial fibrillation: Secondary | ICD-10-CM | POA: Diagnosis not present

## 2021-06-19 DIAGNOSIS — M899 Disorder of bone, unspecified: Secondary | ICD-10-CM | POA: Diagnosis not present

## 2021-06-19 DIAGNOSIS — I10 Essential (primary) hypertension: Secondary | ICD-10-CM | POA: Diagnosis not present

## 2021-06-19 DIAGNOSIS — M8580 Other specified disorders of bone density and structure, unspecified site: Secondary | ICD-10-CM | POA: Diagnosis not present

## 2021-06-19 DIAGNOSIS — R5383 Other fatigue: Secondary | ICD-10-CM | POA: Diagnosis not present

## 2021-06-29 DIAGNOSIS — Z23 Encounter for immunization: Secondary | ICD-10-CM | POA: Diagnosis not present

## 2021-07-12 DIAGNOSIS — Z13820 Encounter for screening for osteoporosis: Secondary | ICD-10-CM | POA: Diagnosis not present

## 2021-07-12 DIAGNOSIS — Z780 Asymptomatic menopausal state: Secondary | ICD-10-CM | POA: Diagnosis not present

## 2021-07-12 DIAGNOSIS — M85851 Other specified disorders of bone density and structure, right thigh: Secondary | ICD-10-CM | POA: Diagnosis not present

## 2021-07-24 DIAGNOSIS — R053 Chronic cough: Secondary | ICD-10-CM | POA: Diagnosis not present

## 2021-07-24 DIAGNOSIS — R0602 Shortness of breath: Secondary | ICD-10-CM | POA: Diagnosis not present

## 2021-07-24 DIAGNOSIS — I10 Essential (primary) hypertension: Secondary | ICD-10-CM | POA: Diagnosis not present

## 2021-07-24 DIAGNOSIS — I517 Cardiomegaly: Secondary | ICD-10-CM | POA: Diagnosis not present

## 2021-08-08 DIAGNOSIS — E7800 Pure hypercholesterolemia, unspecified: Secondary | ICD-10-CM | POA: Diagnosis not present

## 2021-08-16 DIAGNOSIS — M79671 Pain in right foot: Secondary | ICD-10-CM | POA: Diagnosis not present

## 2021-08-23 DIAGNOSIS — F331 Major depressive disorder, recurrent, moderate: Secondary | ICD-10-CM | POA: Diagnosis not present

## 2021-08-23 DIAGNOSIS — R5383 Other fatigue: Secondary | ICD-10-CM | POA: Diagnosis not present

## 2021-08-23 DIAGNOSIS — F329 Major depressive disorder, single episode, unspecified: Secondary | ICD-10-CM | POA: Diagnosis not present

## 2021-08-23 DIAGNOSIS — F4321 Adjustment disorder with depressed mood: Secondary | ICD-10-CM | POA: Diagnosis not present

## 2021-10-26 DIAGNOSIS — I10 Essential (primary) hypertension: Secondary | ICD-10-CM | POA: Diagnosis not present

## 2021-10-26 DIAGNOSIS — E7800 Pure hypercholesterolemia, unspecified: Secondary | ICD-10-CM | POA: Diagnosis not present

## 2021-10-26 DIAGNOSIS — G479 Sleep disorder, unspecified: Secondary | ICD-10-CM | POA: Diagnosis not present

## 2021-10-26 DIAGNOSIS — F349 Persistent mood [affective] disorder, unspecified: Secondary | ICD-10-CM | POA: Diagnosis not present

## 2021-10-27 DIAGNOSIS — H35372 Puckering of macula, left eye: Secondary | ICD-10-CM | POA: Diagnosis not present

## 2021-10-27 DIAGNOSIS — H40013 Open angle with borderline findings, low risk, bilateral: Secondary | ICD-10-CM | POA: Diagnosis not present

## 2021-10-27 DIAGNOSIS — H33321 Round hole, right eye: Secondary | ICD-10-CM | POA: Diagnosis not present

## 2021-10-27 DIAGNOSIS — H15833 Staphyloma posticum, bilateral: Secondary | ICD-10-CM | POA: Diagnosis not present

## 2021-11-17 DIAGNOSIS — M79671 Pain in right foot: Secondary | ICD-10-CM | POA: Diagnosis not present

## 2021-11-27 DIAGNOSIS — F330 Major depressive disorder, recurrent, mild: Secondary | ICD-10-CM | POA: Diagnosis not present

## 2021-11-27 DIAGNOSIS — F329 Major depressive disorder, single episode, unspecified: Secondary | ICD-10-CM | POA: Diagnosis not present

## 2021-11-27 DIAGNOSIS — R5383 Other fatigue: Secondary | ICD-10-CM | POA: Diagnosis not present

## 2022-01-19 DIAGNOSIS — M10061 Idiopathic gout, right knee: Secondary | ICD-10-CM | POA: Diagnosis not present

## 2022-02-01 DIAGNOSIS — M8580 Other specified disorders of bone density and structure, unspecified site: Secondary | ICD-10-CM | POA: Diagnosis not present

## 2022-02-01 DIAGNOSIS — F331 Major depressive disorder, recurrent, moderate: Secondary | ICD-10-CM | POA: Diagnosis not present

## 2022-02-01 DIAGNOSIS — R638 Other symptoms and signs concerning food and fluid intake: Secondary | ICD-10-CM | POA: Diagnosis not present

## 2022-02-01 DIAGNOSIS — Z8774 Personal history of (corrected) congenital malformations of heart and circulatory system: Secondary | ICD-10-CM | POA: Diagnosis not present

## 2022-02-01 DIAGNOSIS — M8588 Other specified disorders of bone density and structure, other site: Secondary | ICD-10-CM | POA: Diagnosis not present

## 2022-02-01 DIAGNOSIS — M25461 Effusion, right knee: Secondary | ICD-10-CM | POA: Diagnosis not present

## 2022-02-01 DIAGNOSIS — I10 Essential (primary) hypertension: Secondary | ICD-10-CM | POA: Diagnosis not present

## 2022-02-01 DIAGNOSIS — R5382 Chronic fatigue, unspecified: Secondary | ICD-10-CM | POA: Diagnosis not present

## 2022-02-01 DIAGNOSIS — I483 Typical atrial flutter: Secondary | ICD-10-CM | POA: Diagnosis not present

## 2022-02-01 DIAGNOSIS — I480 Paroxysmal atrial fibrillation: Secondary | ICD-10-CM | POA: Diagnosis not present

## 2022-02-09 DIAGNOSIS — R350 Frequency of micturition: Secondary | ICD-10-CM | POA: Diagnosis not present

## 2022-02-09 DIAGNOSIS — S81801A Unspecified open wound, right lower leg, initial encounter: Secondary | ICD-10-CM | POA: Diagnosis not present

## 2022-02-09 DIAGNOSIS — I10 Essential (primary) hypertension: Secondary | ICD-10-CM | POA: Diagnosis not present

## 2022-02-09 DIAGNOSIS — J3489 Other specified disorders of nose and nasal sinuses: Secondary | ICD-10-CM | POA: Diagnosis not present

## 2022-02-20 DIAGNOSIS — I483 Typical atrial flutter: Secondary | ICD-10-CM | POA: Diagnosis not present

## 2022-02-20 DIAGNOSIS — I10 Essential (primary) hypertension: Secondary | ICD-10-CM | POA: Diagnosis not present

## 2022-02-20 DIAGNOSIS — M79671 Pain in right foot: Secondary | ICD-10-CM | POA: Diagnosis not present

## 2022-02-20 DIAGNOSIS — I4819 Other persistent atrial fibrillation: Secondary | ICD-10-CM | POA: Diagnosis not present

## 2022-03-29 DIAGNOSIS — I4819 Other persistent atrial fibrillation: Secondary | ICD-10-CM | POA: Diagnosis not present

## 2022-04-27 DIAGNOSIS — J45909 Unspecified asthma, uncomplicated: Secondary | ICD-10-CM | POA: Diagnosis not present

## 2022-04-27 DIAGNOSIS — I4819 Other persistent atrial fibrillation: Secondary | ICD-10-CM | POA: Diagnosis not present

## 2022-04-27 DIAGNOSIS — I10 Essential (primary) hypertension: Secondary | ICD-10-CM | POA: Diagnosis not present

## 2022-04-27 DIAGNOSIS — Z79899 Other long term (current) drug therapy: Secondary | ICD-10-CM | POA: Diagnosis not present

## 2022-04-27 DIAGNOSIS — Z853 Personal history of malignant neoplasm of breast: Secondary | ICD-10-CM | POA: Diagnosis not present

## 2022-04-27 DIAGNOSIS — Z7901 Long term (current) use of anticoagulants: Secondary | ICD-10-CM | POA: Diagnosis not present

## 2022-04-27 DIAGNOSIS — I483 Typical atrial flutter: Secondary | ICD-10-CM | POA: Diagnosis not present

## 2022-04-30 DIAGNOSIS — Z7901 Long term (current) use of anticoagulants: Secondary | ICD-10-CM | POA: Diagnosis not present

## 2022-04-30 DIAGNOSIS — I4819 Other persistent atrial fibrillation: Secondary | ICD-10-CM | POA: Diagnosis not present

## 2022-04-30 DIAGNOSIS — I517 Cardiomegaly: Secondary | ICD-10-CM | POA: Diagnosis not present

## 2022-04-30 DIAGNOSIS — Z8589 Personal history of malignant neoplasm of other organs and systems: Secondary | ICD-10-CM | POA: Diagnosis not present

## 2022-04-30 DIAGNOSIS — Q2112 Patent foramen ovale: Secondary | ICD-10-CM | POA: Diagnosis not present

## 2022-04-30 DIAGNOSIS — I4891 Unspecified atrial fibrillation: Secondary | ICD-10-CM | POA: Diagnosis not present

## 2022-04-30 DIAGNOSIS — I471 Supraventricular tachycardia: Secondary | ICD-10-CM | POA: Diagnosis not present

## 2022-04-30 DIAGNOSIS — Z853 Personal history of malignant neoplasm of breast: Secondary | ICD-10-CM | POA: Diagnosis not present

## 2022-04-30 DIAGNOSIS — I483 Typical atrial flutter: Secondary | ICD-10-CM | POA: Diagnosis not present

## 2022-04-30 DIAGNOSIS — T8131XA Disruption of external operation (surgical) wound, not elsewhere classified, initial encounter: Secondary | ICD-10-CM | POA: Diagnosis not present

## 2022-04-30 DIAGNOSIS — I480 Paroxysmal atrial fibrillation: Secondary | ICD-10-CM | POA: Diagnosis not present

## 2022-04-30 DIAGNOSIS — R69 Illness, unspecified: Secondary | ICD-10-CM | POA: Diagnosis not present

## 2022-04-30 DIAGNOSIS — I340 Nonrheumatic mitral (valve) insufficiency: Secondary | ICD-10-CM | POA: Diagnosis not present

## 2022-04-30 DIAGNOSIS — Z79899 Other long term (current) drug therapy: Secondary | ICD-10-CM | POA: Diagnosis not present

## 2022-04-30 DIAGNOSIS — Z9889 Other specified postprocedural states: Secondary | ICD-10-CM | POA: Diagnosis not present

## 2022-04-30 DIAGNOSIS — I10 Essential (primary) hypertension: Secondary | ICD-10-CM | POA: Diagnosis not present

## 2022-04-30 DIAGNOSIS — I4892 Unspecified atrial flutter: Secondary | ICD-10-CM | POA: Diagnosis not present

## 2022-04-30 DIAGNOSIS — T8130XA Disruption of wound, unspecified, initial encounter: Secondary | ICD-10-CM | POA: Diagnosis not present

## 2022-04-30 DIAGNOSIS — I5189 Other ill-defined heart diseases: Secondary | ICD-10-CM | POA: Diagnosis not present

## 2022-05-18 DIAGNOSIS — I10 Essential (primary) hypertension: Secondary | ICD-10-CM | POA: Diagnosis not present

## 2022-05-21 DIAGNOSIS — R059 Cough, unspecified: Secondary | ICD-10-CM | POA: Diagnosis not present

## 2022-05-21 DIAGNOSIS — I483 Typical atrial flutter: Secondary | ICD-10-CM | POA: Diagnosis not present

## 2022-05-21 DIAGNOSIS — Z9889 Other specified postprocedural states: Secondary | ICD-10-CM | POA: Diagnosis not present

## 2022-05-21 DIAGNOSIS — R0602 Shortness of breath: Secondary | ICD-10-CM | POA: Diagnosis not present

## 2022-05-21 DIAGNOSIS — I480 Paroxysmal atrial fibrillation: Secondary | ICD-10-CM | POA: Diagnosis not present

## 2022-05-21 DIAGNOSIS — I10 Essential (primary) hypertension: Secondary | ICD-10-CM | POA: Diagnosis not present

## 2022-05-22 DIAGNOSIS — R0602 Shortness of breath: Secondary | ICD-10-CM | POA: Diagnosis not present

## 2022-05-22 DIAGNOSIS — R059 Cough, unspecified: Secondary | ICD-10-CM | POA: Diagnosis not present

## 2022-05-24 DIAGNOSIS — I517 Cardiomegaly: Secondary | ICD-10-CM | POA: Diagnosis not present

## 2022-05-24 DIAGNOSIS — R059 Cough, unspecified: Secondary | ICD-10-CM | POA: Diagnosis not present

## 2022-05-24 DIAGNOSIS — Z954 Presence of other heart-valve replacement: Secondary | ICD-10-CM | POA: Diagnosis not present

## 2022-05-31 DIAGNOSIS — K219 Gastro-esophageal reflux disease without esophagitis: Secondary | ICD-10-CM | POA: Diagnosis not present

## 2022-05-31 DIAGNOSIS — Z79899 Other long term (current) drug therapy: Secondary | ICD-10-CM | POA: Diagnosis not present

## 2022-05-31 DIAGNOSIS — I480 Paroxysmal atrial fibrillation: Secondary | ICD-10-CM | POA: Diagnosis not present

## 2022-05-31 DIAGNOSIS — I440 Atrioventricular block, first degree: Secondary | ICD-10-CM | POA: Diagnosis not present

## 2022-05-31 DIAGNOSIS — R000 Tachycardia, unspecified: Secondary | ICD-10-CM | POA: Diagnosis not present

## 2022-05-31 DIAGNOSIS — I10 Essential (primary) hypertension: Secondary | ICD-10-CM | POA: Diagnosis not present

## 2022-05-31 DIAGNOSIS — Z7901 Long term (current) use of anticoagulants: Secondary | ICD-10-CM | POA: Diagnosis not present

## 2022-05-31 DIAGNOSIS — J45909 Unspecified asthma, uncomplicated: Secondary | ICD-10-CM | POA: Diagnosis not present

## 2022-05-31 DIAGNOSIS — I4891 Unspecified atrial fibrillation: Secondary | ICD-10-CM | POA: Diagnosis not present

## 2022-06-11 DIAGNOSIS — I480 Paroxysmal atrial fibrillation: Secondary | ICD-10-CM | POA: Diagnosis not present

## 2022-06-26 DIAGNOSIS — M79671 Pain in right foot: Secondary | ICD-10-CM | POA: Diagnosis not present

## 2022-06-26 DIAGNOSIS — I484 Atypical atrial flutter: Secondary | ICD-10-CM | POA: Diagnosis not present

## 2022-06-26 DIAGNOSIS — I480 Paroxysmal atrial fibrillation: Secondary | ICD-10-CM | POA: Diagnosis not present

## 2022-06-26 DIAGNOSIS — I10 Essential (primary) hypertension: Secondary | ICD-10-CM | POA: Diagnosis not present

## 2022-06-26 DIAGNOSIS — Z9889 Other specified postprocedural states: Secondary | ICD-10-CM | POA: Diagnosis not present

## 2022-06-26 DIAGNOSIS — Z8774 Personal history of (corrected) congenital malformations of heart and circulatory system: Secondary | ICD-10-CM | POA: Diagnosis not present

## 2022-06-26 DIAGNOSIS — I483 Typical atrial flutter: Secondary | ICD-10-CM | POA: Diagnosis not present

## 2022-07-02 DIAGNOSIS — I484 Atypical atrial flutter: Secondary | ICD-10-CM | POA: Diagnosis not present

## 2022-07-02 DIAGNOSIS — I10 Essential (primary) hypertension: Secondary | ICD-10-CM | POA: Diagnosis not present

## 2022-07-02 DIAGNOSIS — I5189 Other ill-defined heart diseases: Secondary | ICD-10-CM | POA: Diagnosis not present

## 2022-07-02 DIAGNOSIS — I517 Cardiomegaly: Secondary | ICD-10-CM | POA: Diagnosis not present

## 2022-07-02 DIAGNOSIS — Q2112 Patent foramen ovale: Secondary | ICD-10-CM | POA: Diagnosis not present

## 2022-07-02 DIAGNOSIS — I081 Rheumatic disorders of both mitral and tricuspid valves: Secondary | ICD-10-CM | POA: Diagnosis not present

## 2022-07-02 DIAGNOSIS — Z79899 Other long term (current) drug therapy: Secondary | ICD-10-CM | POA: Diagnosis not present

## 2022-07-02 DIAGNOSIS — I480 Paroxysmal atrial fibrillation: Secondary | ICD-10-CM | POA: Diagnosis not present

## 2022-07-02 DIAGNOSIS — K219 Gastro-esophageal reflux disease without esophagitis: Secondary | ICD-10-CM | POA: Diagnosis not present

## 2022-07-02 DIAGNOSIS — I483 Typical atrial flutter: Secondary | ICD-10-CM | POA: Diagnosis not present

## 2022-07-02 DIAGNOSIS — Z7902 Long term (current) use of antithrombotics/antiplatelets: Secondary | ICD-10-CM | POA: Diagnosis not present

## 2022-07-02 DIAGNOSIS — J45909 Unspecified asthma, uncomplicated: Secondary | ICD-10-CM | POA: Diagnosis not present

## 2022-07-02 DIAGNOSIS — Z853 Personal history of malignant neoplasm of breast: Secondary | ICD-10-CM | POA: Diagnosis not present

## 2022-07-02 DIAGNOSIS — I4719 Other supraventricular tachycardia: Secondary | ICD-10-CM | POA: Diagnosis not present

## 2022-07-02 DIAGNOSIS — Z9889 Other specified postprocedural states: Secondary | ICD-10-CM | POA: Diagnosis not present

## 2022-07-03 DIAGNOSIS — I483 Typical atrial flutter: Secondary | ICD-10-CM | POA: Diagnosis not present

## 2022-07-03 DIAGNOSIS — Z853 Personal history of malignant neoplasm of breast: Secondary | ICD-10-CM | POA: Diagnosis not present

## 2022-07-03 DIAGNOSIS — J45909 Unspecified asthma, uncomplicated: Secondary | ICD-10-CM | POA: Diagnosis not present

## 2022-07-03 DIAGNOSIS — I10 Essential (primary) hypertension: Secondary | ICD-10-CM | POA: Diagnosis not present

## 2022-07-03 DIAGNOSIS — I480 Paroxysmal atrial fibrillation: Secondary | ICD-10-CM | POA: Diagnosis not present

## 2022-07-03 DIAGNOSIS — I4719 Other supraventricular tachycardia: Secondary | ICD-10-CM | POA: Diagnosis not present

## 2022-07-03 DIAGNOSIS — I484 Atypical atrial flutter: Secondary | ICD-10-CM | POA: Diagnosis not present

## 2022-07-17 DIAGNOSIS — Z7901 Long term (current) use of anticoagulants: Secondary | ICD-10-CM | POA: Diagnosis not present

## 2022-07-17 DIAGNOSIS — I484 Atypical atrial flutter: Secondary | ICD-10-CM | POA: Diagnosis not present

## 2022-07-17 DIAGNOSIS — I495 Sick sinus syndrome: Secondary | ICD-10-CM | POA: Diagnosis not present

## 2022-07-17 DIAGNOSIS — I429 Cardiomyopathy, unspecified: Secondary | ICD-10-CM | POA: Diagnosis not present

## 2022-07-17 DIAGNOSIS — I10 Essential (primary) hypertension: Secondary | ICD-10-CM | POA: Diagnosis not present

## 2022-07-17 DIAGNOSIS — Z8774 Personal history of (corrected) congenital malformations of heart and circulatory system: Secondary | ICD-10-CM | POA: Diagnosis not present

## 2022-07-17 DIAGNOSIS — R000 Tachycardia, unspecified: Secondary | ICD-10-CM | POA: Diagnosis not present

## 2022-07-17 DIAGNOSIS — K219 Gastro-esophageal reflux disease without esophagitis: Secondary | ICD-10-CM | POA: Diagnosis not present

## 2022-07-17 DIAGNOSIS — I480 Paroxysmal atrial fibrillation: Secondary | ICD-10-CM | POA: Diagnosis not present

## 2022-07-17 DIAGNOSIS — J45909 Unspecified asthma, uncomplicated: Secondary | ICD-10-CM | POA: Diagnosis not present

## 2022-07-17 DIAGNOSIS — I471 Supraventricular tachycardia, unspecified: Secondary | ICD-10-CM | POA: Diagnosis not present

## 2022-07-17 DIAGNOSIS — Z79899 Other long term (current) drug therapy: Secondary | ICD-10-CM | POA: Diagnosis not present

## 2022-07-17 DIAGNOSIS — I4811 Longstanding persistent atrial fibrillation: Secondary | ICD-10-CM | POA: Diagnosis not present

## 2022-07-17 DIAGNOSIS — I43 Cardiomyopathy in diseases classified elsewhere: Secondary | ICD-10-CM | POA: Diagnosis not present

## 2022-07-18 DIAGNOSIS — J939 Pneumothorax, unspecified: Secondary | ICD-10-CM | POA: Diagnosis not present

## 2022-07-18 DIAGNOSIS — Z8774 Personal history of (corrected) congenital malformations of heart and circulatory system: Secondary | ICD-10-CM | POA: Diagnosis not present

## 2022-07-18 DIAGNOSIS — Z7901 Long term (current) use of anticoagulants: Secondary | ICD-10-CM | POA: Diagnosis not present

## 2022-07-18 DIAGNOSIS — I10 Essential (primary) hypertension: Secondary | ICD-10-CM | POA: Diagnosis not present

## 2022-07-18 DIAGNOSIS — I480 Paroxysmal atrial fibrillation: Secondary | ICD-10-CM | POA: Diagnosis not present

## 2022-07-18 DIAGNOSIS — R918 Other nonspecific abnormal finding of lung field: Secondary | ICD-10-CM | POA: Diagnosis not present

## 2022-07-18 DIAGNOSIS — I471 Supraventricular tachycardia, unspecified: Secondary | ICD-10-CM | POA: Diagnosis not present

## 2022-07-18 DIAGNOSIS — I484 Atypical atrial flutter: Secondary | ICD-10-CM | POA: Diagnosis not present

## 2022-07-18 DIAGNOSIS — Z950 Presence of cardiac pacemaker: Secondary | ICD-10-CM | POA: Diagnosis not present

## 2022-07-18 DIAGNOSIS — J9811 Atelectasis: Secondary | ICD-10-CM | POA: Diagnosis not present

## 2022-07-21 DIAGNOSIS — Z23 Encounter for immunization: Secondary | ICD-10-CM | POA: Diagnosis not present

## 2022-07-31 DIAGNOSIS — Z950 Presence of cardiac pacemaker: Secondary | ICD-10-CM | POA: Diagnosis not present

## 2022-07-31 DIAGNOSIS — J939 Pneumothorax, unspecified: Secondary | ICD-10-CM | POA: Diagnosis not present

## 2022-08-01 DIAGNOSIS — M79671 Pain in right foot: Secondary | ICD-10-CM | POA: Diagnosis not present

## 2022-08-01 DIAGNOSIS — M79672 Pain in left foot: Secondary | ICD-10-CM | POA: Diagnosis not present

## 2022-08-07 DIAGNOSIS — I480 Paroxysmal atrial fibrillation: Secondary | ICD-10-CM | POA: Diagnosis not present

## 2022-08-07 DIAGNOSIS — G4709 Other insomnia: Secondary | ICD-10-CM | POA: Diagnosis not present

## 2022-08-07 DIAGNOSIS — R7989 Other specified abnormal findings of blood chemistry: Secondary | ICD-10-CM | POA: Diagnosis not present

## 2022-08-07 DIAGNOSIS — Z23 Encounter for immunization: Secondary | ICD-10-CM | POA: Diagnosis not present

## 2022-08-07 DIAGNOSIS — R809 Proteinuria, unspecified: Secondary | ICD-10-CM | POA: Diagnosis not present

## 2022-08-07 DIAGNOSIS — F331 Major depressive disorder, recurrent, moderate: Secondary | ICD-10-CM | POA: Diagnosis not present

## 2022-08-07 DIAGNOSIS — I483 Typical atrial flutter: Secondary | ICD-10-CM | POA: Diagnosis not present

## 2022-08-07 DIAGNOSIS — I10 Essential (primary) hypertension: Secondary | ICD-10-CM | POA: Diagnosis not present

## 2022-08-07 DIAGNOSIS — Z1231 Encounter for screening mammogram for malignant neoplasm of breast: Secondary | ICD-10-CM | POA: Diagnosis not present

## 2022-08-07 DIAGNOSIS — Z0000 Encounter for general adult medical examination without abnormal findings: Secondary | ICD-10-CM | POA: Diagnosis not present

## 2022-08-15 DIAGNOSIS — R7989 Other specified abnormal findings of blood chemistry: Secondary | ICD-10-CM | POA: Diagnosis not present

## 2022-08-15 DIAGNOSIS — I483 Typical atrial flutter: Secondary | ICD-10-CM | POA: Diagnosis not present

## 2022-08-15 DIAGNOSIS — R809 Proteinuria, unspecified: Secondary | ICD-10-CM | POA: Diagnosis not present

## 2022-08-15 DIAGNOSIS — F331 Major depressive disorder, recurrent, moderate: Secondary | ICD-10-CM | POA: Diagnosis not present

## 2022-08-15 DIAGNOSIS — I10 Essential (primary) hypertension: Secondary | ICD-10-CM | POA: Diagnosis not present

## 2022-08-15 DIAGNOSIS — I480 Paroxysmal atrial fibrillation: Secondary | ICD-10-CM | POA: Diagnosis not present

## 2022-08-16 DIAGNOSIS — Z23 Encounter for immunization: Secondary | ICD-10-CM | POA: Diagnosis not present

## 2022-08-20 ENCOUNTER — Other Ambulatory Visit (HOSPITAL_COMMUNITY): Payer: Self-pay

## 2022-08-28 DIAGNOSIS — I4819 Other persistent atrial fibrillation: Secondary | ICD-10-CM | POA: Diagnosis not present

## 2022-10-29 NOTE — Progress Notes (Signed)
[facility] Note  10/31/2022     CHIEF COMPLAINT Patient presents for Retina Evaluation   HISTORY OF PRESENT ILLNESS: Katrina Holt is a 77 y.o. female who presents to the clinic today for:   HPI     Retina Evaluation   In right eye.  Associated Symptoms Floaters.  Negative for Flashes, Distortion and Blind Spot.  I, the attending physician,  performed the HPI with the patient and updated documentation appropriately.        Comments   Patient is here today based on a referral from Dr. 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 She is using AT's OU QAM.       Last edited by Bernarda Caffey, MD on 10/31/2022  4:38 PM.    Pt is here on the referral of Dr. Lucianne Lei for concern of atrophic holes OD, pt saw her a few days ago for a routine eye exam, pt was severely myopic, but does not wear correction bc she has had cataract sx OU, pt states she has floaters, but no fol, she states she has "some" high blood pressure which she does take medication for  Referring physician: Lisabeth Pick, MD [place],  [place] [address]  HISTORICAL INFORMATION:   Selected notes from the MEDICAL RECORD NUMBER Referred by Dr. Lucianne Lei for concern of operculum OD LEE:  Ocular Hx- PMH-    CURRENT MEDICATIONS: No current outpatient medications on file. (Ophthalmic Drugs)   No current facility-administered medications for this visit. (Ophthalmic Drugs)   Current Outpatient Medications (Other)  Medication Sig   apixaban (ELIQUIS) 5 MG TABS tablet Take 5 mg by mouth 2 (two) times daily.    Biotin 5000 MCG TABS Take by mouth every morning.   Calcium Carbonate-Vitamin D (CALCIUM 600+D) 600-200 MG-UNIT TABS Take by mouth every morning.   metoprolol succinate (TOPROL-XL) 50 MG 24 hr tablet    Multiple Vitamins-Minerals (MULTIVITAL PO) Take by mouth every morning.   Omega-3 Fatty Acids (FISH OIL) 1000 MG CAPS Take by mouth every morning.   valACYclovir (VALTREX) 1000 MG  tablet Take 1,000 mg by mouth as needed.   No current facility-administered medications for this visit. (Other)   REVIEW OF SYSTEMS: ROS   Positive for: Eyes Negative for: Constitutional, Gastrointestinal, Neurological, Skin, Genitourinary, Musculoskeletal, HENT, Endocrine, Cardiovascular, Respiratory, Psychiatric, Allergic/Imm, Heme/Lymph Last edited by Bernarda Caffey, MD on 10/31/2022  4:42 PM.     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 43s   Heart Problems Paternal Uncle    Bladder Cancer Maternal Grandfather        smoker   Cancer Paternal Grandmother        unknown type - maybe stomach   Stroke Paternal Grandfather    Prostate cancer Maternal Uncle        dx. late 60s   Ovarian cancer Maternal Aunt  fallopian tube cancer   Breast cancer Maternal Aunt        dx. 55s   Lung cancer Paternal Uncle        dx. 46s   Cancer Cousin        unknown type, but not breast or ovarian   Breast cancer Cousin        dx. 40s-50s   Bladder Cancer Cousin 76       dx. late 32s; smoker   Ovarian cancer Cousin        dx. late 67s   SOCIAL HISTORY Social History   Tobacco Use   Smoking status: Never   Smokeless tobacco: Never   Tobacco comments:    never used tobacco  Substance Use Topics   Alcohol use: Yes    Alcohol/week: 3.0 standard drinks of alcohol    Types: 3 Glasses of wine per week       OPHTHALMIC EXAM:  Base Eye Exam     Visual Acuity (Snellen - Linear)       Right Left   Dist [place] 20/20 +1    Near [place]  J1+          Tonometry (Tonopen, 1:48 PM)       Right Left   Pressure 13 16         Pupils       Dark Light Shape React APD   Right 4 3 Round Brisk None   Left 4 3 Round Brisk None         Visual Fields       Left Right    Full Full         Extraocular Movement       Right Left    Full, Ortho Full, Ortho         Neuro/Psych     Oriented x3: Yes         Dilation     Both eyes: 1.0% Mydriacyl, 2.5% Phenylephrine @ 1:46 PM           Slit Lamp and Fundus Exam     Slit Lamp Exam       Right Left   Lids/Lashes Dermatochalasis - upper lid, Meibomian gland dysfunction Dermatochalasis - upper lid, Meibomian gland dysfunction   Conjunctiva/Sclera White and quiet White and quiet   Cornea arcus, trace PEE arcus, trace PEE   Anterior Chamber deep and clear deep and clear   Iris Round and dilated Round and dilated   Lens PC IOL in good position with open PC PC IOL in good position with open PC   Anterior Vitreous Vitreous syneresis, no pigment, Posterior vitreous detachment Vitreous syneresis, no pigment, Posterior vitreous detachment, Weiss ring         Fundus Exam       Right Left   Disc mild Pallor, Sharp rim, 360 PPA, posterior staphyloma Pink and Sharp, Tilted, temporal PPA/PPP   C/D Ratio 0.5 0.5   Macula Flat, Blunted foveal reflex, RPE mottling and clumping, No heme or edema Blunted foveal reflex, RPE mottling and clumping, No heme or edema   Vessels attenuated, mild tortuosity attenuated, mild tortuosity   Periphery Attached, round atrophic hypopigmented CR scar at 0115, scattered peripheral pigmented cystoid degeneration almost 360, inferior paving stone degeneration Attached, mild reticular degeneration, inferior paving stone degeneration           IMAGING AND PROCEDURES  Imaging and Procedures for 10/31/2022  OCT, Retina - OU - Both Eyes  Right Eye Quality was good. Central Foveal Thickness: 282. Progression has no prior data. Findings  include normal foveal contour, no IRF, no SRF, myopic contour.   Left Eye Quality was good. Central Foveal Thickness: 283. Progression has no prior data. Findings include normal foveal contour, no IRF, no SRF, myopic contour, epiretinal membrane (Mild ERM).   Notes *Images captured and stored on drive  Diagnosis / Impression:  NFP, no IRF/SRF OU Mild ERM OS  Clinical management:  See below  Abbreviations: NFP - Normal foveal profile. CME - cystoid macular edema. PED - pigment epithelial detachment. IRF - intraretinal fluid. SRF - subretinal fluid. EZ - ellipsoid zone. ERM - epiretinal membrane. ORA - outer retinal atrophy. ORT - outer retinal tubulation. SRHM - subretinal hyper-reflective material. 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 Epiretinal membrane (ERM) of left eye  H35.372 OCT, Retina - OU - Both Eyes    3. Essential hypertension  I10     4. Hypertensive retinopathy of both eyes  H35.033     5. History of myopia  Z86.69     6. Pseudophakia of both eyes  Z96.1      Chorioretinal scar OD - round hypopigmented CR scar at 0115 periphery -- concerning for retinal hole - asymptomatic -- no FOL - no retinal hole or SRF on scleral depression - discussed findings, prognosis - no treatment recommended at this time -- will monitor - reviewed s/s of RT/RD -- strict return precautions for any such RT/RD signs/symptoms  - f/u 2 months, DFE, OCT  2. Epiretinal membrane, left eye  - The natural history, anatomy, potential for loss of vision, and treatment options including vitrectomy techniques and the complications of endophthalmitis, retinal detachment, vitreous hemorrhage, cataract progression and permanent vision loss discussed with the patient. - mild ERM - BCVA J1+ - asymptomatic, no metamorphopsia - no indication for surgery at this  time - monitor for now - f/u 2 mos -- DFE/OCT   3,4. Hypertensive retinopathy OU - discussed importance of tight BP control - monitor  5,6. History of high myopia, corrected by cataract surgery OU  - OCT with severely myopic contour OU - Pseudophakia OU  - s/p CE/IOL OU (one eye by Dr. Katy Fitch, another by Dr. Herbert Deaner)  - IOLs in good position, doing well  - monitor  Ophthalmic Meds Ordered this visit:  No orders of the defined types were placed in this encounter.    Return in about 2 months (around 12/30/2022) for f/u CR scar OD, DFE, OCT.  There are no Patient Instructions on file for this visit.   Explained the diagnoses, plan, and follow up with the patient and they expressed understanding.  Patient expressed understanding of the importance of proper follow up care.   This document serves as a record of services personally performed by Gardiner Sleeper, MD, PhD. It was created on their behalf by Orvan Falconer, an ophthalmic technician. The creation of this record is the provider's dictation and/or activities during the visit.    Electronically signed by: Orvan Falconer, OA, 10/31/22  4:42 PM  This document serves as a record of services personally performed by Gardiner Sleeper, MD, PhD. It was created on their behalf by San Jetty. Owens Shark, OA an ophthalmic technician. The creation of this record  is the provider's dictation and/or activities during the visit.    Electronically signed by: San Jetty. Owens Shark, [place] 02.28.2024 4:42 PM  Gardiner Sleeper, M.D., Ph.D. Diseases & Surgery of the Retina and Vitreous [facility]  I have reviewed the above documentation for accuracy and completeness, and I agree with the above. Gardiner Sleeper, M.D., Ph.D. 10/31/22 5:01 PM  Abbreviations: M myopia (nearsighted); A astigmatism; H hyperopia (farsighted); P presbyopia; Mrx spectacle prescription;  CTL contact lenses; OD right eye; OS left eye; OU both eyes  XT exotropia; ET  esotropia; PEK punctate epithelial keratitis; PEE punctate epithelial erosions; DES dry eye syndrome; MGD meibomian gland dysfunction; ATs artificial tears; PFAT's preservative free artificial tears; [place] nuclear sclerotic cataract; PSC posterior subcapsular cataract; ERM epi-retinal membrane; PVD posterior vitreous detachment; RD retinal detachment; DM diabetes mellitus; DR diabetic retinopathy; NPDR non-proliferative diabetic retinopathy; PDR proliferative diabetic retinopathy; CSME clinically significant macular edema; DME diabetic macular edema; dbh dot blot hemorrhages; CWS cotton wool spot; POAG primary open angle glaucoma; C/D cup-to-disc ratio; HVF humphrey visual field; GVF goldmann visual field; OCT optical coherence tomography; IOP intraocular pressure; BRVO Branch retinal vein occlusion; CRVO central retinal vein occlusion; CRAO central retinal artery occlusion; BRAO branch retinal artery occlusion; RT retinal tear; SB scleral buckle; PPV pars plana vitrectomy; VH Vitreous hemorrhage; PRP panretinal laser photocoagulation; IVK intravitreal kenalog; VMT vitreomacular traction; MH Macular hole;  NVD neovascularization of the disc; NVE neovascularization elsewhere; AREDS age related eye disease study; ARMD age related macular degeneration; POAG primary open angle glaucoma; EBMD epithelial/anterior basement membrane dystrophy; ACIOL anterior chamber intraocular lens; IOL intraocular lens; PCIOL posterior chamber intraocular lens; Phaco/IOL phacoemulsification with intraocular lens placement; [place] photorefractive keratectomy; LASIK laser assisted in situ keratomileusis; HTN hypertension; DM diabetes mellitus; COPD chronic obstructive pulmonary disease

## 2022-10-31 ENCOUNTER — Encounter (INDEPENDENT_AMBULATORY_CARE_PROVIDER_SITE_OTHER): Payer: Self-pay | Admitting: Ophthalmology

## 2022-10-31 ENCOUNTER — Encounter (INDEPENDENT_AMBULATORY_CARE_PROVIDER_SITE_OTHER): Payer: Medicare Other | Admitting: Ophthalmology

## 2022-10-31 ENCOUNTER — Ambulatory Visit (INDEPENDENT_AMBULATORY_CARE_PROVIDER_SITE_OTHER): Payer: Medicare Other | Admitting: Ophthalmology

## 2022-10-31 DIAGNOSIS — I10 Essential (primary) hypertension: Secondary | ICD-10-CM | POA: Diagnosis not present

## 2022-10-31 DIAGNOSIS — H3581 Retinal edema: Secondary | ICD-10-CM

## 2022-10-31 DIAGNOSIS — H31001 Unspecified chorioretinal scars, right eye: Secondary | ICD-10-CM | POA: Diagnosis not present

## 2022-10-31 DIAGNOSIS — H35372 Puckering of macula, left eye: Secondary | ICD-10-CM

## 2022-10-31 DIAGNOSIS — Z961 Presence of intraocular lens: Secondary | ICD-10-CM

## 2022-10-31 DIAGNOSIS — Z8669 Personal history of other diseases of the nervous system and sense organs: Secondary | ICD-10-CM | POA: Diagnosis not present

## 2022-10-31 DIAGNOSIS — H35033 Hypertensive retinopathy, bilateral: Secondary | ICD-10-CM

## 2022-12-24 NOTE — Progress Notes (Signed)
[facility] - Clinic Note  12/26/2022     CHIEF COMPLAINT Patient presents for Retina Follow Up   HISTORY OF PRESENT ILLNESS: Katrina Holt is a 77 y.o. female who presents to the clinic today for:   HPI     Retina Follow Up   Patient presents with  Other.  In right eye.  This started weeks ago.  Duration of 2 months.  I, the attending physician,  performed the HPI with the patient and updated documentation appropriately.        Comments   Patient feels that the vision is the same. Feels that she is not seeing the TV as well as she used to. She is using AT's OU PRN.       Last edited by Rennis Chris, MD on 12/26/2022  4:20 PM.    Pt states vision is stable  Referring physician: Diona Foley, MD [address] [place],  [place] [address]  HISTORICAL INFORMATION:   Selected notes from the MEDICAL RECORD NUMBER Referred by Dr. Zenaida Niece for concern of operculum OD LEE:  Ocular Hx- PMH-    CURRENT MEDICATIONS: No current outpatient medications on file. (Ophthalmic Drugs)   No current facility-administered medications for this visit. (Ophthalmic Drugs)   Current Outpatient Medications (Other)  Medication Sig   apixaban (ELIQUIS) 5 MG TABS tablet Take 5 mg by mouth 2 (two) times daily.    Calcium Carbonate-Vitamin D (CALCIUM 600+D) 600-200 MG-UNIT TABS Take by mouth every morning.   Multiple Vitamins-Minerals (MULTIVITAL PO) Take by mouth every morning.   valACYclovir (VALTREX) 1000 MG tablet Take 1,000 mg by mouth as needed.   Biotin 5000 MCG TABS Take by mouth every morning.   metoprolol succinate (TOPROL-XL) 50 MG 24 hr tablet    Omega-3 Fatty Acids (FISH OIL) 1000 MG CAPS Take by mouth every morning.   No current facility-administered medications for this visit. (Other)   REVIEW OF SYSTEMS: ROS   Positive for: Eyes Negative for: Constitutional, Gastrointestinal, Neurological, Skin, Genitourinary, Musculoskeletal, HENT, Endocrine,  Cardiovascular, Respiratory, Psychiatric, Allergic/Imm, Heme/Lymph Last edited by Julieanne Cotton, COT on 12/26/2022  1:21 PM.     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 60s   Heart Problems Paternal Uncle    Bladder Cancer Maternal Grandfather        smoker   Cancer Paternal Grandmother        unknown type - maybe stomach   Stroke Paternal Grandfather    Prostate cancer Maternal Uncle        dx. late 60s   Ovarian cancer Maternal Aunt        fallopian tube cancer   Breast cancer Maternal Aunt        dx. 40s   Lung cancer Paternal Uncle        dx. 60s   Cancer Cousin        unknown type, but  not breast or ovarian   Breast cancer Cousin        dx. 40s-50s   Bladder Cancer Cousin 42       dx. late 60s; smoker   Ovarian cancer Cousin        dx. late 43s   SOCIAL HISTORY Social History   Tobacco Use   Smoking status: Never   Smokeless tobacco: Never   Tobacco comments:    never used tobacco  Substance Use Topics   Alcohol use: Yes    Alcohol/week: 3.0 standard drinks of alcohol    Types: 3 Glasses of wine per week       OPHTHALMIC EXAM:  Base Eye Exam     Visual Acuity (Snellen - Linear)       Right Left   Dist [place] 20/20 +1    Near [place]  J1+         Tonometry (Tonopen, 1:23 PM)       Right Left   Pressure 14 15         Pupils       Dark Light Shape React APD   Right 4 3 Round Brisk None   Left 4 3 Round Brisk None         Visual Fields       Left Right    Full Full         Extraocular Movement        Right Left    Full, Ortho Full, Ortho         Neuro/Psych     Oriented x3: Yes         Dilation     Both eyes: 1.0% Mydriacyl, 2.5% Phenylephrine @ 1:21 PM           Slit Lamp and Fundus Exam     Slit Lamp Exam       Right Left   Lids/Lashes Dermatochalasis - upper lid, Meibomian gland dysfunction Dermatochalasis - upper lid, Meibomian gland dysfunction   Conjunctiva/Sclera White and quiet White and quiet   Cornea arcus, trace PEE arcus, trace PEE   Anterior Chamber deep and clear deep and clear   Iris Round and dilated Round and dilated   Lens PC IOL in good position with open PC PC IOL in good position with open PC   Anterior Vitreous Vitreous syneresis, no pigment, Posterior vitreous detachment Vitreous syneresis, no pigment, Posterior vitreous detachment, Weiss ring, vitreous condensations         Fundus Exam       Right Left   Disc mild Pallor, Sharp rim, 360 PPA, posterior staphyloma Mild pallor, Sharp rim, Tilted, temporal PPA/PPP, Compact   C/D Ratio 0.5 0.5   Macula Flat, Blunted foveal reflex, RPE mottling and clumping, No heme or edema Blunted foveal reflex, RPE mottling and clumping, +ERM, No heme or edema   Vessels attenuated, mild tortuosity attenuated, Tortuous   Periphery Attached, round atrophic hypopigmented CR scar at 0115, scattered peripheral pigmented cystoid degeneration almost 360, inferior paving stone degeneration Attached, mild reticular degeneration, inferior paving stone degeneration           IMAGING AND PROCEDURES  Imaging and Procedures for 12/26/2022  OCT, Retina - OU - Both Eyes       Right Eye Quality was good. Central Foveal Thickness: 284. Progression has been stable. Findings include normal foveal contour, no IRF, no SRF, myopic contour.   Left Eye Quality was good. Central Foveal Thickness: 293. Progression has  been stable. Findings include normal foveal contour, no IRF, no SRF, myopic contour, epiretinal  membrane (Mild ERM).   Notes *Images captured and stored on drive  Diagnosis / Impression:  NFP, no IRF/SRF OU Mild ERM OS  Clinical management:  See below  Abbreviations: NFP - Normal foveal profile. CME - cystoid macular edema. PED - pigment epithelial detachment. IRF - intraretinal fluid. SRF - subretinal fluid. EZ - ellipsoid zone. ERM - epiretinal membrane. ORA - outer retinal atrophy. ORT - outer retinal tubulation. SRHM - subretinal hyper-reflective material. IRHM - intraretinal hyper-reflective material            ASSESSMENT/PLAN:    ICD-10-CM   1. 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 Essential hypertension  I10     4. Hypertensive retinopathy of both eyes  H35.033     5. History of myopia  Z86.69     6. Pseudophakia of both eyes  Z96.1      1. Epiretinal membrane, left eye  - mild ERM - BCVA J1+ - OCT stable -- no progression at 2 mos f/u - asymptomatic, no metamorphopsia - no indication for surgery at this time - monitor for now - f/u 6 mos -- DFE/OCT   2. Chorioretinal scar OD - round hypopigmented CR scar at 0115 periphery -- concerning for retinal hole - asymptomatic -- no FOL - no retinal hole or SRF on scleral depression - discussed findings, prognosis - no treatment recommended at this time -- will monitor - reviewed s/s of RT/RD -- strict return precautions for any such RT/RD signs/symptoms  - monitor  3,4. Hypertensive retinopathy OU - discussed importance of tight BP control - monitor  5,6. History of high myopia, corrected by cataract surgery OU  - OCT with severely myopic contour OU - Pseudophakia OU  - s/p CE/IOL OU (one eye by Dr. Dione Booze, another by Dr. Elmer Picker)  - IOLs in good position, doing well  - monitor  Ophthalmic Meds Ordered this visit:  No orders of the defined types were placed in this encounter.    Return in about 6 months  (around 06/27/2023) for f/u ERM OS, DFE, OCT.  There are no Patient Instructions on file for this visit.   Explained the diagnoses, plan, and follow up with the patient and they expressed understanding.  Patient expressed understanding of the importance of proper follow up care.   This document serves as a record of services personally performed by Karie Chimera, MD, PhD. It was created on their behalf by De Blanch, an ophthalmic technician. The creation of this record is the provider's dictation and/or activities during the visit.    Electronically signed by: De Blanch, OA, 12/26/22  4:21 PM  This document serves as a record of services personally performed by Karie Chimera, MD, PhD. It was created on their behalf by Glee Arvin. Manson Passey, OA an ophthalmic technician. The creation of this record is the provider's dictation and/or activities during the visit.    Electronically signed by: Glee Arvin. [place], [place] 04.24.2024 4:21 PM  Karie Chimera, M.D., Ph.D. Diseases & Surgery of the Retina and Vitreous [facility]  I have reviewed the above documentation for accuracy and completeness, and I agree with the above. Karie Chimera, M.D., Ph.D. 12/26/22 4:22 PM   Abbreviations: M myopia (nearsighted); A  astigmatism; H hyperopia (farsighted); P presbyopia; Mrx spectacle prescription;  CTL contact lenses; OD right eye; OS left eye; OU both eyes  XT exotropia; ET esotropia; PEK punctate epithelial keratitis; PEE punctate epithelial erosions; DES dry eye syndrome; MGD meibomian gland dysfunction; ATs artificial tears; PFAT's preservative free artificial tears; [place] nuclear sclerotic cataract; PSC posterior subcapsular cataract; ERM epi-retinal membrane; PVD posterior vitreous detachment; RD retinal detachment; DM diabetes mellitus; DR diabetic retinopathy; NPDR non-proliferative diabetic retinopathy; PDR proliferative diabetic retinopathy; CSME clinically significant  macular edema; DME diabetic macular edema; dbh dot blot hemorrhages; CWS cotton wool spot; POAG primary open angle glaucoma; C/D cup-to-disc ratio; HVF humphrey visual field; GVF goldmann visual field; OCT optical coherence tomography; IOP intraocular pressure; BRVO Branch retinal vein occlusion; CRVO central retinal vein occlusion; CRAO central retinal artery occlusion; BRAO branch retinal artery occlusion; RT retinal tear; SB scleral buckle; PPV pars plana vitrectomy; VH Vitreous hemorrhage; PRP panretinal laser photocoagulation; IVK intravitreal kenalog; VMT vitreomacular traction; MH Macular hole;  NVD neovascularization of the disc; NVE neovascularization elsewhere; AREDS age related eye disease study; ARMD age related macular degeneration; POAG primary open angle glaucoma; EBMD epithelial/anterior basement membrane dystrophy; ACIOL anterior chamber intraocular lens; IOL intraocular lens; PCIOL posterior chamber intraocular lens; Phaco/IOL phacoemulsification with intraocular lens placement; PRK photorefractive keratectomy; LASIK laser assisted in situ keratomileusis; HTN hypertension; DM diabetes mellitus; COPD chronic obstructive pulmonary disease

## 2022-12-26 ENCOUNTER — Ambulatory Visit (INDEPENDENT_AMBULATORY_CARE_PROVIDER_SITE_OTHER): Payer: Medicare Other | Admitting: Ophthalmology

## 2022-12-26 ENCOUNTER — Encounter (INDEPENDENT_AMBULATORY_CARE_PROVIDER_SITE_OTHER): Payer: Self-pay | Admitting: Ophthalmology

## 2022-12-26 DIAGNOSIS — H35033 Hypertensive retinopathy, bilateral: Secondary | ICD-10-CM

## 2022-12-26 DIAGNOSIS — H31001 Unspecified chorioretinal scars, right eye: Secondary | ICD-10-CM | POA: Diagnosis not present

## 2022-12-26 DIAGNOSIS — H35372 Puckering of macula, left eye: Secondary | ICD-10-CM | POA: Diagnosis not present

## 2022-12-26 DIAGNOSIS — Z8669 Personal history of other diseases of the nervous system and sense organs: Secondary | ICD-10-CM

## 2022-12-26 DIAGNOSIS — I10 Essential (primary) hypertension: Secondary | ICD-10-CM

## 2022-12-26 DIAGNOSIS — Z961 Presence of intraocular lens: Secondary | ICD-10-CM

## 2023-06-24 NOTE — Progress Notes (Addendum)
[facility] - Clinic Note  06/26/2023     CHIEF COMPLAINT Patient presents for Retina Follow Up   HISTORY OF PRESENT ILLNESS: Katrina Holt is a 77 y.o. female who presents to the clinic today for:   HPI     Retina Follow Up   Patient presents with  Other.  In right eye.  This started weeks ago.  Duration of 6 months.  I, the attending physician,  performed the HPI with the patient and updated documentation appropriately.        Comments   Patient feels the vision is the same. She is using AT's OU PRN- QAM.      Last edited by Rennis Chris, MD on 06/26/2023  7:58 PM.    Pt states vision is the same,   Referring physician: Diona Foley, MD [address] [place],  [place] [address]  HISTORICAL INFORMATION:   Selected notes from the MEDICAL RECORD NUMBER Referred by Dr. Zenaida Niece for concern of operculum OD LEE:  Ocular Hx- PMH-    CURRENT MEDICATIONS: No current outpatient medications on file. (Ophthalmic Drugs)   No current facility-administered medications for this visit. (Ophthalmic Drugs)   Current Outpatient Medications (Other)  Medication Sig   apixaban (ELIQUIS) 5 MG TABS tablet Take 5 mg by mouth 2 (two) times daily.    Calcium Carbonate-Vitamin D (CALCIUM 600+D) 600-200 MG-UNIT TABS Take by mouth every morning.   metoprolol succinate (TOPROL-XL) 50 MG 24 hr tablet    Multiple Vitamins-Minerals (MULTIVITAL PO) Take by mouth every morning.   valACYclovir (VALTREX) 1000 MG tablet Take 1,000 mg by mouth as needed.   Biotin 5000 MCG TABS Take by mouth every morning.   Omega-3 Fatty Acids (FISH OIL) 1000 MG CAPS Take by mouth every morning.   No current facility-administered medications for this visit. (Other)   REVIEW OF SYSTEMS: ROS   Positive for: Eyes Negative for: Constitutional, Gastrointestinal, Neurological, Skin, Genitourinary, Musculoskeletal, HENT, Endocrine, Cardiovascular, Respiratory, Psychiatric, Allergic/Imm,  Heme/Lymph Last edited by Charlette Caffey, COT on 06/26/2023  1:05 PM.      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 60s   Heart Problems Paternal Uncle    Bladder Cancer Maternal Grandfather        smoker   Cancer Paternal Grandmother        unknown type - maybe stomach   Stroke Paternal Grandfather    Prostate cancer Maternal Uncle        dx. late 60s   Ovarian cancer Maternal Aunt        fallopian tube cancer   Breast cancer Maternal Aunt        dx. 40s   Lung cancer Paternal Uncle        dx. 60s   Cancer Cousin        unknown type, but not breast or ovarian   Breast cancer Cousin  dx. 40s-50s   Bladder Cancer Cousin 8       dx. late 60s; smoker   Ovarian cancer Cousin        dx. late 56s   SOCIAL HISTORY Social History   Tobacco Use   Smoking status: Never   Smokeless tobacco: Never   Tobacco comments:    never used tobacco  Substance Use Topics   Alcohol use: Yes    Alcohol/week: 3.0 standard drinks of alcohol    Types: 3 Glasses of wine per week       OPHTHALMIC EXAM:  Base Eye Exam     Visual Acuity (Snellen - Linear)       Right Left   Dist [place] 20/20    Near [place]  J1+         Tonometry (Tonopen, 1:07 PM)       Right Left   Pressure 15 15         Pupils       Dark Light Shape React APD   Right 4 3 Round Brisk None   Left 4 3 Round Brisk None         Visual Fields       Left Right    Full Full         Extraocular Movement       Right Left    Full, Ortho Full, Ortho          Neuro/Psych     Oriented x3: Yes         Dilation     Both eyes: 1.0% Mydriacyl, 2.5% Phenylephrine @ 1:05 PM           Slit Lamp and Fundus Exam     Slit Lamp Exam       Right Left   Lids/Lashes Dermatochalasis - upper lid, mild MGD Dermatochalasis - upper lid   Conjunctiva/Sclera White and quiet White and quiet   Cornea arcus, trace PEE, fine endo pigment, well healed cataract wound arcus, well healed cataract wound   Anterior Chamber deep and clear deep and clear   Iris Round and dilated, linear TID from 0900-1000 Round and dilated   Lens PC IOL in good position with open PC PC IOL in good position with open PC   Anterior Vitreous Vitreous syneresis, no pigment, Posterior vitreous detachment Vitreous syneresis, no pigment, Posterior vitreous detachment, Weiss ring, vitreous condensations         Fundus Exam       Right Left   Disc mild Pallor, Sharp rim, 360 PPA, posterior staphyloma Mild pallor, Sharp rim, Tilted, temporal PPA/PPP, Compact   C/D Ratio 0.5 0.5   Macula Flat, Blunted foveal reflex, RPE mottling and clumping, No heme or edema Blunted foveal reflex, RPE mottling and clumping, mild ERM - stable, No heme or edema   Vessels attenuated, mild tortuosity attenuated, Tortuous   Periphery Attached, round atrophic hypopigmented CR scar at 0115, scattered peripheral pigmented cystoid degeneration almost 360, inferior paving stone degeneration, No heme Attached, mild reticular degeneration, inferior paving stone degeneration           IMAGING AND PROCEDURES  Imaging and Procedures for 06/26/2023  OCT, Retina - OU - Both Eyes       Right Eye Quality was good. Central Foveal Thickness: 279. Progression has been stable. Findings include normal foveal contour, no IRF, no SRF, myopic contour.   Left Eye Quality was good. Central Foveal Thickness: 287. Progression has been stable. Findings  include normal foveal contour, no IRF, no SRF, myopic contour,  epiretinal membrane (Mild ERM).   Notes *Images captured and stored on drive  Diagnosis / Impression:  NFP, no IRF/SRF OU Mild ERM OS  Clinical management:  See below  Abbreviations: NFP - Normal foveal profile. CME - cystoid macular edema. PED - pigment epithelial detachment. IRF - intraretinal fluid. SRF - subretinal fluid. EZ - ellipsoid zone. ERM - epiretinal membrane. ORA - outer retinal atrophy. ORT - outer retinal tubulation. SRHM - subretinal hyper-reflective material. IRHM - intraretinal hyper-reflective material            ASSESSMENT/PLAN:    ICD-10-CM   1. 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 Essential hypertension  I10     4. Hypertensive retinopathy of both eyes  H35.033     5. History of myopia  Z86.69     6. Pseudophakia of both eyes  Z96.1       1. Epiretinal membrane, left eye  - mild ERM - BCVA J1+ - OCT stable ERM -- no progression - asymptomatic, no metamorphopsia - no indication for surgery at this time - monitor for now - f/u 9-12 mos -- DFE/OCT   2. Chorioretinal scar OD - round hypopigmented CR scar at 0115 periphery -- simulates retinal hole - asymptomatic -- no FOL - no retinal hole or SRF on scleral depression - discussed findings, prognosis - no treatment recommended at this time -- will monitor - reviewed s/s of RT/RD -- strict return precautions for any such RT/RD signs/symptoms  - monitor  3,4. Hypertensive retinopathy OU - discussed importance of tight BP control - monitor  5,6. History of high myopia, corrected by cataract surgery OU  - OCT with severely myopic contour OU - Pseudophakia OU  - s/p CE/IOL OU (one eye by Dr. Dione Booze, another by Dr. Elmer Picker)  - IOLs in good position, doing well  - monitor  Ophthalmic Meds Ordered this visit:  No orders of the defined types were placed in this encounter.    Return for f/u 9-12  months, ERM OS, DFE, OCT.  There are no Patient Instructions on file for this visit.   Explained the diagnoses, plan, and follow up with the patient and they expressed understanding.  Patient expressed understanding of the importance of proper follow up care.   This document serves as a record of services personally performed by Karie Chimera, MD, PhD. It was created on their behalf by De Blanch, an ophthalmic technician. The creation of this record is the provider's dictation and/or activities during the visit.    Electronically signed by: De Blanch, OA, 06/26/23  8:01 PM  This document serves as a record of services personally performed by Karie Chimera, MD, PhD. It was created on their behalf by Glee Arvin. Manson Passey, OA an ophthalmic technician. The creation of this record is the provider's dictation and/or activities during the visit.    Electronically signed by: Glee Arvin. Manson Passey, OA 06/26/23 8:01 PM  Karie Chimera, M.D., Ph.D. Diseases & Surgery of the Retina and Vitreous [facility]  I have reviewed the above documentation for accuracy and completeness, and I agree with the above. Karie Chimera, M.D., Ph.D. 06/26/23 8:01 PM   Abbreviations: M myopia (nearsighted); A astigmatism; H hyperopia (farsighted); P presbyopia; Mrx spectacle prescription;  CTL contact lenses; OD right eye; OS left eye; OU both eyes  XT exotropia; ET esotropia; PEK punctate epithelial keratitis; PEE punctate epithelial erosions; DES dry eye syndrome; MGD meibomian gland dysfunction; ATs artificial tears; PFAT's preservative free artificial tears; NSC nuclear sclerotic cataract; PSC posterior subcapsular cataract; ERM epi-retinal membrane; PVD posterior vitreous detachment; RD retinal detachment; DM diabetes mellitus; DR diabetic retinopathy; NPDR non-proliferative diabetic retinopathy; PDR proliferative diabetic retinopathy; CSME clinically significant macular edema; DME  diabetic macular edema; dbh dot blot hemorrhages; CWS cotton wool spot; POAG primary open angle glaucoma; C/D cup-to-disc ratio; HVF humphrey visual field; GVF goldmann visual field; OCT optical coherence tomography; IOP intraocular pressure; BRVO Branch retinal vein occlusion; CRVO central retinal vein occlusion; CRAO central retinal artery occlusion; BRAO branch retinal artery occlusion; RT retinal tear; SB scleral buckle; PPV pars plana vitrectomy; VH Vitreous hemorrhage; PRP panretinal laser photocoagulation; IVK intravitreal kenalog; VMT vitreomacular traction; MH Macular hole;  NVD neovascularization of the disc; NVE neovascularization elsewhere; AREDS age related eye disease study; ARMD age related macular degeneration; POAG primary open angle glaucoma; EBMD epithelial/anterior basement membrane dystrophy; ACIOL anterior chamber intraocular lens; IOL intraocular lens; PCIOL posterior chamber intraocular lens; Phaco/IOL phacoemulsification with intraocular lens placement; PRK photorefractive keratectomy; LASIK laser assisted in situ keratomileusis; HTN hypertension; DM diabetes mellitus; COPD chronic obstructive pulmonary disease

## 2023-06-26 ENCOUNTER — Ambulatory Visit (INDEPENDENT_AMBULATORY_CARE_PROVIDER_SITE_OTHER): Payer: Medicare Other | Admitting: Ophthalmology

## 2023-06-26 ENCOUNTER — Encounter (INDEPENDENT_AMBULATORY_CARE_PROVIDER_SITE_OTHER): Payer: Self-pay | Admitting: Ophthalmology

## 2023-06-26 DIAGNOSIS — H35033 Hypertensive retinopathy, bilateral: Secondary | ICD-10-CM | POA: Diagnosis not present

## 2023-06-26 DIAGNOSIS — H31001 Unspecified chorioretinal scars, right eye: Secondary | ICD-10-CM | POA: Diagnosis not present

## 2023-06-26 DIAGNOSIS — I10 Essential (primary) hypertension: Secondary | ICD-10-CM

## 2023-06-26 DIAGNOSIS — H35372 Puckering of macula, left eye: Secondary | ICD-10-CM

## 2023-06-26 DIAGNOSIS — Z8669 Personal history of other diseases of the nervous system and sense organs: Secondary | ICD-10-CM

## 2023-06-26 DIAGNOSIS — Z961 Presence of intraocular lens: Secondary | ICD-10-CM

## 2023-11-07 NOTE — Progress Notes (Signed)
 [facility] - Clinic Note  11/11/2023     CHIEF COMPLAINT Patient presents for Retina Evaluation   HISTORY OF PRESENT ILLNESS: Katrina Holt is a 78 y.o. female who presents to the clinic today for:   HPI     Retina Evaluation   In left eye.  This started 3 months ago.  Duration of 3 months.  Associated Symptoms Pain.  I, the attending physician,  performed the HPI with the patient and updated documentation appropriately.        Comments   Patient here for Retina Evaluation. Patient states vision doing fine. Started 2-3 months ago OS feels dry all the time. Has FB sensation OS. Has some itching. Uses Systane BID OU.       Last edited by Rennis Chris, MD on 11/11/2023 10:07 PM.     Pt states presents acutely today bc she feels like something is in her left eye, she is worried it has something to do with the ERM Dr Vanessa Barbara has been following, she is using Systane every morning and during the day if she is feeling symptoms  Referring physician: Diona Foley, MD [address] [place],  [place] [address]  HISTORICAL INFORMATION:   Selected notes from the MEDICAL RECORD NUMBER Referred by Dr. Zenaida Niece for concern of operculum OD LEE:  Ocular Hx- PMH-    CURRENT MEDICATIONS: Current Outpatient Medications (Ophthalmic Drugs)  Medication Sig   ofloxacin (OCUFLOX) 0.3 % ophthalmic solution Place 1 drop into the left eye 4 (four) times daily for 7 days.   No current facility-administered medications for this visit. (Ophthalmic Drugs)   Current Outpatient Medications (Other)  Medication Sig   apixaban (ELIQUIS) 5 MG TABS tablet Take 5 mg by mouth 2 (two) times daily.    Calcium Carbonate-Vitamin D (CALCIUM 600+D) 600-200 MG-UNIT TABS Take by mouth every morning.   metoprolol succinate (TOPROL-XL) 50 MG 24 hr tablet    Multiple Vitamins-Minerals (MULTIVITAL PO) Take by mouth every morning.   valACYclovir (VALTREX) 1000 MG tablet Take 1,000 mg by mouth as  needed.   Biotin 5000 MCG TABS Take by mouth every morning.   Omega-3 Fatty Acids (FISH OIL) 1000 MG CAPS Take by mouth every morning.   No current facility-administered medications for this visit. (Other)   REVIEW OF SYSTEMS: ROS   Positive for: Eyes Negative for: Constitutional, Gastrointestinal, Neurological, Skin, Genitourinary, Musculoskeletal, HENT, Endocrine, Cardiovascular, Respiratory, Psychiatric, Allergic/Imm, Heme/Lymph Last edited by Laddie Aquas, COA on 11/11/2023  9:15 AM.       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 60s   Heart Problems Paternal Uncle    Bladder Cancer Maternal Grandfather        smoker   Cancer Paternal Grandmother        unknown type - maybe stomach   Stroke Paternal Grandfather    Prostate cancer Maternal Uncle  dx. late 60s   Ovarian cancer Maternal Aunt        fallopian tube cancer   Breast cancer Maternal Aunt        dx. 40s   Lung cancer Paternal Uncle        dx. 60s   Cancer Cousin        unknown type, but not breast or ovarian   Breast cancer Cousin        dx. 40s-50s   Bladder Cancer Cousin 25       dx. late 61s; smoker   Ovarian cancer Cousin        dx. late 12s   SOCIAL HISTORY Social History   Tobacco Use   Smoking status: Never   Smokeless tobacco: Never   Tobacco comments:    never used tobacco  Vaping Use   Vaping status: Never Used  Substance Use Topics   Alcohol use: Yes    Alcohol/week: 3.0 standard drinks of alcohol    Types: 3 Glasses of wine per week        OPHTHALMIC EXAM:  Base Eye Exam     Visual Acuity (Snellen - Linear)       Right Left   Dist [place] 20/20 20/150 +2   Dist ph [place]  20/25 -2         Tonometry (Tonopen, 9:09 AM)       Right Left   Pressure 13 13         Pupils       Dark Light Shape React APD   Right 4 3 Round Brisk None   Left 4 3 Round Brisk None         Visual Fields (Counting fingers)       Left Right    Full Full         Extraocular Movement       Right Left    Full, Ortho Full, Ortho         Neuro/Psych     Oriented x3: Yes   Mood/Affect: Normal         Dilation     Both eyes: 1.0% Mydriacyl, 2.5% Phenylephrine @ 9:09 AM           Slit Lamp and Fundus Exam     Slit Lamp Exam       Right Left   Lids/Lashes Dermatochalasis - upper lid, mild MGD Dermatochalasis - upper lid   Conjunctiva/Sclera White and quiet White and quiet, no injection   Cornea arcus, trace PEE, fine endo pigment, well healed cataract wound arcus, well healed cataract wound, trace PEE, trace EBMD, mild tear film debris, punctate epi irregularity inferior paracentral   Anterior Chamber deep and clear deep and clear   Iris Round and dilated, linear TID from 0900-1000 Round and dilated   Lens PC IOL in good position with open PC PC IOL in good position with open PC   Anterior Vitreous Vitreous syneresis, no pigment, Posterior vitreous detachment Vitreous syneresis, no pigment, Posterior vitreous detachment, Weiss ring, vitreous condensations         Fundus Exam       Right Left   Disc mild Pallor, Sharp rim, 360 PPA, posterior staphyloma Mild pallor, Sharp rim, Tilted, temporal PPA/PPP, Compact   C/D Ratio 0.5 0.5   Macula Flat, Blunted foveal reflex, RPE mottling and clumping, No heme or edema Blunted foveal reflex, RPE mottling and clumping, mild ERM - stable, No heme or edema  Vessels attenuated, mild tortuosity attenuated, Tortuous   Periphery Attached, round atrophic hypopigmented CR  scar at 0115, scattered peripheral pigmented cystoid degeneration almost 360, inferior paving stone degeneration, No heme Attached, mild reticular degeneration, inferior paving stone degeneration           IMAGING AND PROCEDURES  Imaging and Procedures for 11/11/2023  OCT, Retina - OU - Both Eyes       Right Eye Quality was good. Central Foveal Thickness: 279. Progression has been stable. Findings include normal foveal contour, no IRF, no SRF, myopic contour.   Left Eye Quality was good. Central Foveal Thickness: 292. Progression has been stable. Findings include normal foveal contour, no IRF, no SRF, myopic contour, epiretinal membrane (Mild ERM).   Notes *Images captured and stored on drive  Diagnosis / Impression:  NFP, no IRF/SRF OU Mild ERM OS  Clinical management:  See below  Abbreviations: NFP - Normal foveal profile. CME - cystoid macular edema. PED - pigment epithelial detachment. IRF - intraretinal fluid. SRF - subretinal fluid. EZ - ellipsoid zone. ERM - epiretinal membrane. ORA - outer retinal atrophy. ORT - outer retinal tubulation. SRHM - subretinal hyper-reflective material. IRHM - intraretinal hyper-reflective material            ASSESSMENT/PLAN:    ICD-10-CM   1. Keratopathy  H18.9     2. Epiretinal membrane (ERM) of left eye  H35.372 OCT, Retina - OU - Both Eyes    3. 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 Essential hypertension  I10     5. Hypertensive retinopathy of both eyes  H35.033     6. History of myopia  Z86.69     7. Pseudophakia of both eyes  Z96.1      **Pt presents acutely today for irritation / FBS OS**  - exam shows mild corneal epi irregularity inferior paracentral - no frank epi defect  - recommend ofloxacin QID OS x7 days and artificial tears as needed  - pt can f/u as scheduled in July -- sooner prn  1. Epiretinal membrane, left eye  - mild ERM - BCVA J1+ - OCT stable ERM -- no progression - asymptomatic, no  metamorphopsia - no indication for surgery at this time - monitor for now - f/u 9-12 mos -- DFE/OCT   2. Chorioretinal scar OD - round hypopigmented CR scar at 0115 periphery -- simulates retinal hole - asymptomatic -- no FOL - no retinal hole or SRF on scleral depression - discussed findings, prognosis - no treatment recommended at this time -- will monitor - reviewed s/s of RT/RD -- strict return precautions for any such RT/RD signs/symptoms  - monitor  3,4. Hypertensive retinopathy OU - discussed importance of tight BP control - monitor  5,6. History of high myopia, corrected by cataract surgery OU  - OCT with severely myopic contour OU - Pseudophakia OU  - s/p CE/IOL OU (one eye by Dr. Dione Booze, another by Dr. Elmer Picker)  - IOLs in good position, doing well  - monitor  Ophthalmic Meds Ordered this visit:  Meds ordered this encounter  Medications   ofloxacin (OCUFLOX) 0.3 % ophthalmic solution    Sig: Place 1 drop into the left eye 4 (four) times daily for 7 days.    Dispense:  10 mL    Refill:  0     Return for ERM as scheduled in July.  There are no Patient Instructions on file for this visit.   Explained the  diagnoses, plan, and follow up with the patient and they expressed understanding.  Patient expressed understanding of the importance of proper follow up care.   This document serves as a record of services personally performed by Karie Chimera, MD, PhD. It was created on their behalf by Glee Arvin. Manson Passey, OA an ophthalmic technician. The creation of this record is the provider's dictation and/or activities during the visit.    Electronically signed by: Glee Arvin. Manson Passey, OA 11/11/23 10:10 PM   Karie Chimera, M.D., Ph.D. Diseases & Surgery of the Retina and Vitreous [facility]  I have reviewed the above documentation for accuracy and completeness, and I agree with the above. Karie Chimera, M.D., Ph.D. 11/11/23 10:12 PM     Abbreviations: M myopia (nearsighted); A astigmatism; H hyperopia (farsighted); P presbyopia; Mrx spectacle prescription;  CTL contact lenses; OD right eye; OS left eye; OU both eyes  XT exotropia; ET esotropia; PEK punctate epithelial keratitis; PEE punctate epithelial erosions; DES dry eye syndrome; MGD meibomian gland dysfunction; ATs artificial tears; PFAT's preservative free artificial tears; NSC nuclear sclerotic cataract; PSC posterior subcapsular cataract; ERM epi-retinal membrane; PVD posterior vitreous detachment; RD retinal detachment; DM diabetes mellitus; DR diabetic retinopathy; NPDR non-proliferative diabetic retinopathy; PDR proliferative diabetic retinopathy; CSME clinically significant macular edema; DME diabetic macular edema; dbh dot blot hemorrhages; CWS cotton wool spot; POAG primary open angle glaucoma; C/D cup-to-disc ratio; HVF humphrey visual field; GVF goldmann visual field; OCT optical coherence tomography; IOP intraocular pressure; BRVO Branch retinal vein occlusion; CRVO central retinal vein occlusion; CRAO central retinal artery occlusion; BRAO branch retinal artery occlusion; RT retinal tear; SB scleral buckle; PPV pars plana vitrectomy; VH Vitreous hemorrhage; PRP panretinal laser photocoagulation; IVK intravitreal kenalog; VMT vitreomacular traction; MH Macular hole;  NVD neovascularization of the disc; NVE neovascularization elsewhere; AREDS age related eye disease study; ARMD age related macular degeneration; POAG primary open angle glaucoma; EBMD epithelial/anterior basement membrane dystrophy; ACIOL anterior chamber intraocular lens; IOL intraocular lens; PCIOL posterior chamber intraocular lens; Phaco/IOL phacoemulsification with intraocular lens placement; PRK photorefractive keratectomy; LASIK laser assisted in situ keratomileusis; HTN hypertension; DM diabetes mellitus; COPD chronic obstructive pulmonary disease

## 2023-11-11 ENCOUNTER — Ambulatory Visit (INDEPENDENT_AMBULATORY_CARE_PROVIDER_SITE_OTHER): Admitting: Ophthalmology

## 2023-11-11 ENCOUNTER — Encounter (INDEPENDENT_AMBULATORY_CARE_PROVIDER_SITE_OTHER): Payer: Self-pay | Admitting: Ophthalmology

## 2023-11-11 DIAGNOSIS — I10 Essential (primary) hypertension: Secondary | ICD-10-CM

## 2023-11-11 DIAGNOSIS — H35033 Hypertensive retinopathy, bilateral: Secondary | ICD-10-CM

## 2023-11-11 DIAGNOSIS — H35372 Puckering of macula, left eye: Secondary | ICD-10-CM

## 2023-11-11 DIAGNOSIS — H189 Unspecified disorder of cornea: Secondary | ICD-10-CM

## 2023-11-11 DIAGNOSIS — H31001 Unspecified chorioretinal scars, right eye: Secondary | ICD-10-CM

## 2023-11-11 DIAGNOSIS — Z8669 Personal history of other diseases of the nervous system and sense organs: Secondary | ICD-10-CM

## 2023-11-11 DIAGNOSIS — Z961 Presence of intraocular lens: Secondary | ICD-10-CM

## 2023-11-11 MED ORDER — OFLOXACIN 0.3 % OP SOLN: 1.0000 [drp] | Freq: Four times a day (QID) | OPHTHALMIC | 0 refills | Status: AC

## 2024-03-26 NOTE — Progress Notes (Signed)
 [facility] - Clinic Note  04/01/2024     CHIEF COMPLAINT Patient presents for Retina Follow Up   HISTORY OF PRESENT ILLNESS: Katrina Holt is a 78 y.o. female who presents to the clinic today for:   HPI     Retina Follow Up   In left eye.  This started 4 months ago.  Duration of months.  Since onset it is stable.  I, the attending physician,  performed the HPI with the patient and updated documentation appropriately.        Comments   4 month retina follow up ERM OS pt is reporting no vision changes noticed she denies any flashes has some floaters       Last edited by Valdemar Rogue, MD on 04/05/2024 12:27 PM.    Pt states she is not having any problems, she is using drops for debris in her eyes  Referring physician: Fleeta Zerita DASEN, MD [address] [place],  [place] [address]  HISTORICAL INFORMATION:   Selected notes from the MEDICAL RECORD NUMBER Referred by Dr. Fleeta for concern of operculum OD LEE:  Ocular Hx- PMH-    CURRENT MEDICATIONS: No current outpatient medications on file. (Ophthalmic Drugs)   No current facility-administered medications for this visit. (Ophthalmic Drugs)   Current Outpatient Medications (Other)  Medication Sig   apixaban (ELIQUIS) 5 MG TABS tablet Take 5 mg by mouth 2 (two) times daily.    Calcium Carbonate-Vitamin D  (CALCIUM 600+D) 600-200 MG-UNIT TABS Take by mouth every morning.   metoprolol succinate (TOPROL-XL) 50 MG 24 hr tablet    Multiple Vitamins-Minerals (MULTIVITAL PO) Take by mouth every morning.   valACYclovir (VALTREX) 1000 MG tablet Take 1,000 mg by mouth as needed.   Biotin 5000 MCG TABS Take by mouth every morning.   Omega-3 Fatty Acids (FISH OIL) 1000 MG CAPS Take by mouth every morning.   No current facility-administered medications for this visit. (Other)   REVIEW OF SYSTEMS: ROS   Positive for: Eyes Negative for: Constitutional, Gastrointestinal, Neurological, Skin, Genitourinary,  Musculoskeletal, HENT, Endocrine, Cardiovascular, Respiratory, Psychiatric, Allergic/Imm, Heme/Lymph Last edited by Resa Delon ORN, COT on 04/01/2024  1:02 PM.     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 60s   Heart Problems Paternal Uncle    Bladder Cancer Maternal Grandfather        smoker   Cancer Paternal Grandmother        unknown type - maybe stomach   Stroke Paternal Grandfather    Prostate cancer Maternal Uncle        dx. late 60s   Ovarian cancer Maternal Aunt        fallopian tube cancer   Breast cancer Maternal Aunt        dx. 40s   Lung cancer Paternal Uncle        dx. 60s   Cancer Cousin  unknown type, but not breast or ovarian   Breast cancer Cousin        dx. 40s-50s   Bladder Cancer Cousin 44       dx. late 63s; smoker   Ovarian cancer Cousin        dx. late 59s   SOCIAL HISTORY Social History   Tobacco Use   Smoking status: Never   Smokeless tobacco: Never   Tobacco comments:    never used tobacco  Vaping Use   Vaping status: Never Used  Substance Use Topics   Alcohol use: Yes    Alcohol/week: 3.0 standard drinks of alcohol    Types: 3 Glasses of wine per week       OPHTHALMIC EXAM:  Base Eye Exam     Visual Acuity (Snellen - Linear)       Right Left   Dist [place] 20/20 20/20  Mono OD D OS N         Tonometry (Tonopen, 1:04 PM)       Right Left   Pressure 15 17         Pupils       Pupils Dark Light Shape React APD   Right PERRL 4 3 Round Brisk None   Left PERRL 4 3 Round  Brisk None         Extraocular Movement       Right Left    Full, Ortho Full, Ortho         Neuro/Psych     Oriented x3: Yes   Mood/Affect: Normal         Dilation     Both eyes: 2.5% Phenylephrine  @ 1:04 PM           Slit Lamp and Fundus Exam     Slit Lamp Exam       Right Left   Lids/Lashes Dermatochalasis - upper lid, mild MGD Dermatochalasis - upper lid   Conjunctiva/Sclera White and quiet White and quiet, no injection   Cornea arcus, 1-2+ inferior PEE, fine endo pigment, well healed cataract wound arcus, well healed cataract wound, trace PEE, trace EBMD, mild tear film debris, punctate epi irregularity inferior paracentral   Anterior Chamber deep and clear deep and clear   Iris Round and dilated, linear TID from 0900-1000 Round and dilated   Lens PC IOL in good position with open PC PC IOL in good position with open PC   Anterior Vitreous Vitreous syneresis, no pigment, Posterior vitreous detachment, Weiss ring Vitreous syneresis, no pigment, Posterior vitreous detachment, Weiss ring, vitreous condensations         Fundus Exam       Right Left   Disc mild Pallor, Sharp rim, 360 PPA, posterior staphyloma Mild pallor, Sharp rim, Tilted, temporal PPA/PPP, Compact   C/D Ratio 0.5 0.5   Macula Flat, Blunted foveal reflex, RPE mottling and clumping, No heme or edema Blunted foveal reflex, RPE mottling and clumping, mild ERM - stable, No heme or edema   Vessels attenuated, mild tortuosity attenuated, Tortuous   Periphery Attached, round atrophic hypopigmented CR scar at 0115, scattered peripheral pigmented cystoid degeneration almost 360, inferior paving stone degeneration, No heme Attached, mild reticular degeneration, inferior paving stone degeneration           IMAGING AND PROCEDURES  Imaging and Procedures for 04/01/2024  OCT, Retina - OU - Both Eyes       Right Eye Quality was good. Central Foveal Thickness: 283. Progression has been  stable.  Findings include normal foveal contour, no IRF, no SRF, myopic contour.   Left Eye Quality was good. Central Foveal Thickness: 285. Progression has been stable. Findings include normal foveal contour, no IRF, no SRF, myopic contour, epiretinal membrane (Mild ERM -- no change from prior).   Notes *Images captured and stored on drive  Diagnosis / Impression:  NFP, no IRF/SRF OU Mild ERM OS -- no change from prior  Clinical management:  See below  Abbreviations: NFP - Normal foveal profile. CME - cystoid macular edema. PED - pigment epithelial detachment. IRF - intraretinal fluid. SRF - subretinal fluid. EZ - ellipsoid zone. ERM - epiretinal membrane. ORA - outer retinal atrophy. ORT - outer retinal tubulation. SRHM - subretinal hyper-reflective material. IRHM - intraretinal hyper-reflective material            ASSESSMENT/PLAN:    ICD-10-CM   1. 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 Essential hypertension  I10     4. Hypertensive retinopathy of both eyes  H35.033     5. History of myopia  Z86.69     6. Pseudophakia of both eyes  Z96.1      1. Epiretinal membrane, left eye  - mild ERM - BCVA J1+ - OCT stable ERM -- no progression - asymptomatic, no metamorphopsia - no indication for surgery at this time - monitor for now - f/u 1 yr -- DFE/OCT   2. Chorioretinal scar OD - round hypopigmented CR scar at 0115 periphery -- simulates retinal hole - asymptomatic -- no FOL - no retinal hole or SRF on scleral depression - discussed findings, prognosis - no treatment recommended at this time -- will monitor - reviewed s/s of RT/RD -- strict return precautions for any such RT/RD signs/symptoms  - monitor  3,4. Hypertensive retinopathy OU - discussed importance of tight BP control - monitor  5,6. History of high myopia, corrected by cataract surgery OU  - OCT with severely myopic  contour OU - Pseudophakia OU  - s/p CE/IOL OU (one eye by Dr. Octavia, another by Dr. Cleatus)  - IOLs in good position, doing well  - monitor  Ophthalmic Meds Ordered this visit:  No orders of the defined types were placed in this encounter.    Return in about 1 year (around 04/01/2025) for f/u ERM OS, DFE, OCT.  There are no Patient Instructions on file for this visit.   Explained the diagnoses, plan, and follow up with the patient and they expressed understanding.  Patient expressed understanding of the importance of proper follow up care.   This document serves as a record of services personally performed by Redell JUDITHANN Hans, MD, PhD. It was created on their behalf by Almetta Pesa, an ophthalmic technician. The creation of this record is the provider's dictation and/or activities during the visit.    Electronically signed by: Almetta Pesa, OA, 04/05/24  12:29 PM  This document serves as a record of services personally performed by Redell JUDITHANN Hans, MD, PhD. It was created on their behalf by Alan PARAS. Delores, OA an ophthalmic technician. The creation of this record is the provider's dictation and/or activities during the visit.    Electronically signed by: Alan PARAS. Delores, OA 04/05/24 12:29 PM   Redell JUDITHANN Hans, M.D., Ph.D. Diseases & Surgery of the Retina and Vitreous [facility]  I have reviewed the above documentation for accuracy and completeness, and I agree with the above. Redell JUDITHANN Hans, M.D., Ph.D. 04/05/24 12:30 PM   Abbreviations: M myopia (nearsighted); A astigmatism; H hyperopia (farsighted); P presbyopia; Mrx spectacle prescription;  CTL contact lenses; OD right eye; OS left eye; OU both eyes  XT exotropia; ET esotropia; PEK punctate epithelial keratitis; PEE punctate epithelial erosions; DES dry eye syndrome; MGD meibomian gland dysfunction; ATs artificial tears; PFAT's preservative free artificial tears; NSC nuclear sclerotic cataract; PSC  posterior subcapsular cataract; ERM epi-retinal membrane; PVD posterior vitreous detachment; RD retinal detachment; DM diabetes mellitus; DR diabetic retinopathy; NPDR non-proliferative diabetic retinopathy; PDR proliferative diabetic retinopathy; CSME clinically significant macular edema; DME diabetic macular edema; dbh dot blot hemorrhages; CWS cotton wool spot; POAG primary open angle glaucoma; C/D cup-to-disc ratio; HVF humphrey visual field; GVF goldmann visual field; OCT optical coherence tomography; IOP intraocular pressure; BRVO Branch retinal vein occlusion; CRVO central retinal vein occlusion; CRAO central retinal artery occlusion; BRAO branch retinal artery occlusion; RT retinal tear; SB scleral buckle; PPV pars plana vitrectomy; VH Vitreous hemorrhage; PRP panretinal laser photocoagulation; IVK intravitreal kenalog ; VMT vitreomacular traction; MH Macular hole;  NVD neovascularization of the disc; NVE neovascularization elsewhere; AREDS age related eye disease study; ARMD age related macular degeneration; POAG primary open angle glaucoma; EBMD epithelial/anterior basement membrane dystrophy; ACIOL anterior chamber intraocular lens; IOL intraocular lens; PCIOL posterior chamber intraocular lens; Phaco/IOL phacoemulsification with intraocular lens placement; PRK photorefractive keratectomy; LASIK laser assisted in situ keratomileusis; HTN hypertension; DM diabetes mellitus; COPD chronic obstructive pulmonary disease

## 2024-04-01 ENCOUNTER — Ambulatory Visit (INDEPENDENT_AMBULATORY_CARE_PROVIDER_SITE_OTHER): Payer: Medicare Other | Admitting: Ophthalmology

## 2024-04-01 ENCOUNTER — Encounter (INDEPENDENT_AMBULATORY_CARE_PROVIDER_SITE_OTHER): Payer: Self-pay | Admitting: Ophthalmology

## 2024-04-01 DIAGNOSIS — H35033 Hypertensive retinopathy, bilateral: Secondary | ICD-10-CM

## 2024-04-01 DIAGNOSIS — H35372 Puckering of macula, left eye: Secondary | ICD-10-CM | POA: Diagnosis not present

## 2024-04-01 DIAGNOSIS — Z8669 Personal history of other diseases of the nervous system and sense organs: Secondary | ICD-10-CM

## 2024-04-01 DIAGNOSIS — Z961 Presence of intraocular lens: Secondary | ICD-10-CM

## 2024-04-01 DIAGNOSIS — H31001 Unspecified chorioretinal scars, right eye: Secondary | ICD-10-CM

## 2024-04-01 DIAGNOSIS — I10 Essential (primary) hypertension: Secondary | ICD-10-CM

## 2024-04-05 ENCOUNTER — Encounter (INDEPENDENT_AMBULATORY_CARE_PROVIDER_SITE_OTHER): Payer: Self-pay | Admitting: Ophthalmology

## 2025-03-31 ENCOUNTER — Encounter (INDEPENDENT_AMBULATORY_CARE_PROVIDER_SITE_OTHER): Admitting: Ophthalmology
